# Patient Record
Sex: Male | Born: 1960 | Race: White | Hispanic: No | Marital: Married | Smoking: Never smoker
Health system: Southern US, Community
[De-identification: ages and names within clinical notes are randomized; demographics above are authoritative.]

## PROBLEM LIST (undated history)

## (undated) DIAGNOSIS — R202 Paresthesia of skin: Secondary | ICD-10-CM

## (undated) DIAGNOSIS — I2510 Atherosclerotic heart disease of native coronary artery without angina pectoris: Secondary | ICD-10-CM

## (undated) DIAGNOSIS — I10 Essential (primary) hypertension: Secondary | ICD-10-CM

## (undated) DIAGNOSIS — I4891 Unspecified atrial fibrillation: Secondary | ICD-10-CM

## (undated) DIAGNOSIS — Z951 Presence of aortocoronary bypass graft: Secondary | ICD-10-CM

## (undated) DIAGNOSIS — E785 Hyperlipidemia, unspecified: Secondary | ICD-10-CM

## (undated) DIAGNOSIS — K219 Gastro-esophageal reflux disease without esophagitis: Secondary | ICD-10-CM

## (undated) DIAGNOSIS — R079 Chest pain, unspecified: Secondary | ICD-10-CM

## (undated) DIAGNOSIS — I214 Non-ST elevation (NSTEMI) myocardial infarction: Secondary | ICD-10-CM

## (undated) DIAGNOSIS — E119 Type 2 diabetes mellitus without complications: Secondary | ICD-10-CM

## (undated) DIAGNOSIS — E039 Hypothyroidism, unspecified: Secondary | ICD-10-CM

## (undated) DIAGNOSIS — E0590 Thyrotoxicosis, unspecified without thyrotoxic crisis or storm: Secondary | ICD-10-CM

## (undated) HISTORY — DX: Thyrotoxicosis, unspecified without thyrotoxic crisis or storm: E05.90

## (undated) HISTORY — DX: Type 2 diabetes mellitus without complications: E11.9

## (undated) HISTORY — DX: Unspecified atrial fibrillation: I48.91

## (undated) HISTORY — DX: Gastro-esophageal reflux disease without esophagitis: K21.9

## (undated) HISTORY — PX: UMBILICAL HERNIA REPAIR: SHX196

## (undated) HISTORY — DX: Essential (primary) hypertension: I10

## (undated) HISTORY — DX: Chest pain, unspecified: R07.9

## (undated) HISTORY — PX: SHOULDER SURGERY: SHX246

## (undated) HISTORY — DX: Atherosclerotic heart disease of native coronary artery without angina pectoris: I25.10

## (undated) HISTORY — DX: Hyperlipidemia, unspecified: E78.5

## (undated) HISTORY — DX: Hypothyroidism, unspecified: E03.9

## (undated) HISTORY — DX: Non-ST elevation (NSTEMI) myocardial infarction: I21.4

## (undated) HISTORY — DX: Presence of aortocoronary bypass graft: Z95.1

## (undated) HISTORY — DX: Paresthesia of skin: R20.2

---

## 2000-04-16 ENCOUNTER — Ambulatory Visit (HOSPITAL_BASED_OUTPATIENT_CLINIC_OR_DEPARTMENT_OTHER): Admission: RE | Admit: 2000-04-16 | Discharge: 2000-04-16 | Payer: Self-pay | Admitting: General Surgery

## 2004-12-26 ENCOUNTER — Ambulatory Visit: Payer: Self-pay | Admitting: Family Medicine

## 2005-01-07 ENCOUNTER — Ambulatory Visit: Payer: Self-pay | Admitting: Family Medicine

## 2005-01-15 ENCOUNTER — Ambulatory Visit: Payer: Self-pay | Admitting: Family Medicine

## 2005-12-17 ENCOUNTER — Ambulatory Visit: Payer: Self-pay | Admitting: Family Medicine

## 2009-01-22 ENCOUNTER — Ambulatory Visit: Payer: Self-pay | Admitting: Cardiology

## 2009-01-22 ENCOUNTER — Inpatient Hospital Stay (HOSPITAL_COMMUNITY): Admission: EM | Admit: 2009-01-22 | Discharge: 2009-01-23 | Payer: Self-pay | Admitting: Emergency Medicine

## 2009-01-22 ENCOUNTER — Ambulatory Visit: Payer: Self-pay | Admitting: Internal Medicine

## 2009-01-24 ENCOUNTER — Ambulatory Visit: Payer: Self-pay

## 2009-03-01 DIAGNOSIS — R079 Chest pain, unspecified: Secondary | ICD-10-CM | POA: Insufficient documentation

## 2009-03-01 DIAGNOSIS — K219 Gastro-esophageal reflux disease without esophagitis: Secondary | ICD-10-CM

## 2009-03-01 DIAGNOSIS — E039 Hypothyroidism, unspecified: Secondary | ICD-10-CM

## 2009-03-01 DIAGNOSIS — E785 Hyperlipidemia, unspecified: Secondary | ICD-10-CM

## 2009-03-01 DIAGNOSIS — I10 Essential (primary) hypertension: Secondary | ICD-10-CM

## 2009-03-01 HISTORY — DX: Essential (primary) hypertension: I10

## 2009-03-01 HISTORY — DX: Hypothyroidism, unspecified: E03.9

## 2009-03-01 HISTORY — DX: Chest pain, unspecified: R07.9

## 2009-03-01 HISTORY — DX: Hyperlipidemia, unspecified: E78.5

## 2009-03-01 HISTORY — DX: Gastro-esophageal reflux disease without esophagitis: K21.9

## 2009-03-05 ENCOUNTER — Ambulatory Visit: Payer: Self-pay | Admitting: Cardiology

## 2011-02-20 LAB — BASIC METABOLIC PANEL
BUN: 12 mg/dL (ref 6–23)
Calcium: 8.9 mg/dL (ref 8.4–10.5)
Chloride: 110 mEq/L (ref 96–112)
Creatinine, Ser: 1.07 mg/dL (ref 0.4–1.5)
Creatinine, Ser: 1.14 mg/dL (ref 0.4–1.5)
GFR calc Af Amer: >60 mL/min (ref >60)
GFR calc Af Amer: >60 mL/min (ref >60)
GFR calc non Af Amer: >60 mL/min (ref >60)
Sodium: 142 mEq/L (ref 135–145)

## 2011-02-20 LAB — CBC
HCT: 39 % (ref 39.0–52.0)
MCV: 88.7 fL (ref 78.0–100.0)
Platelets: 196 10*3/uL (ref 150–400)
Platelets: 213 10*3/uL (ref 150–400)
RDW: 13.4 % (ref 11.5–15.5)
WBC: 5 10*3/uL (ref 4.0–10.5)

## 2011-02-20 LAB — HEPATIC FUNCTION PANEL
ALT: 26 U/L (ref 0–53)
Albumin: 4 g/dL (ref 3.5–5.2)
Alkaline Phosphatase: 52 U/L (ref 39–117)
Total Protein: 6.6 g/dL (ref 6.0–8.3)

## 2011-02-20 LAB — CARDIAC PANEL(CRET KIN+CKTOT+MB+TROPI)
CK, MB: 0.9 ng/mL (ref 0.3–4.0)
CK, MB: 1 ng/mL (ref 0.3–4.0)
CK, MB: 0.8 ng/mL (ref 0.3–4.0)
Relative Index: 0.6 (ref 0.0–2.5)
Total CK: 156 U/L (ref 7–232)
Total CK: 183 U/L (ref 7–232)
Troponin I: <0.01 ng/mL (ref 0.00–0.06)
Troponin I: <0.01 ng/mL (ref 0.00–0.06)

## 2011-02-20 LAB — RAPID URINE DRUG SCREEN, HOSP PERFORMED: Benzodiazepines: POSITIVE — AB

## 2011-02-20 LAB — PROTIME-INR
INR: 1 (ref 0.00–1.49)
Prothrombin Time: 13.2 seconds (ref 11.6–15.2)

## 2011-02-20 LAB — TSH: TSH: 2.436 u[IU]/mL (ref 0.350–4.500)

## 2011-02-20 LAB — TROPONIN I: Troponin I: <0.01 ng/mL (ref 0.00–0.06)

## 2011-02-20 LAB — LIPID PANEL
Triglycerides: 255 mg/dL — ABNORMAL HIGH (ref <150)
VLDL: 51 mg/dL — ABNORMAL HIGH (ref 0–40)

## 2011-02-20 LAB — CK TOTAL AND CKMB (NOT AT ARMC)
CK, MB: 0.8 ng/mL (ref 0.3–4.0)
Relative Index: 0.5 (ref 0.0–2.5)
Total CK: 175 U/L (ref 7–232)

## 2011-02-20 LAB — APTT: aPTT: 33 seconds (ref 24–37)

## 2011-02-20 LAB — POCT CARDIAC MARKERS: Troponin i, poc: <0.05 ng/mL (ref 0.00–0.09)

## 2011-02-20 LAB — HEPARIN LEVEL (UNFRACTIONATED): Heparin Unfractionated: 0.17 IU/mL — ABNORMAL LOW (ref 0.30–0.70)

## 2011-02-20 LAB — LIPASE, BLOOD: Lipase: 23 U/L (ref 11–59)

## 2011-03-25 NOTE — Consult Note (Signed)
Nicholas Chambers, Nicholas Chambers             ACCOUNT NO.:  [id number]   MEDICAL RECORD NO.:  [id number]          PATIENT TYPE:  INP   LOCATION:  2921                         FACILITY:  [facility]   PHYSICIAN:  Luis Abed, MD, FACCDATE OF BIRTH:  August 17, 1961   DATE OF CONSULTATION:  01/22/2009  DATE OF DISCHARGE:                                 CONSULTATION   PRIMARY CARDIOLOGIST:  New, being seen by Luis Abed, MD.   PRIMARY CARE PHYSICIAN:  Dr. Dina Rich in [place].   Mr. Cavanagh is a pleasant 50 year old Caucasian gentleman with no known  history of coronary artery disease, who presented to [facility]  Emergency Room today with complaints of chest discomfort that started on  Saturday.  Two days ago, he was shopping with his children.  He began to  have this localized chest discomfort, then radiating to both shoulders.  He described it as ache and pressure.  He rated it 6 on a scale of 1-10  at its worse.  He feels like it was worse with exertion and improved  with rest.  He said in his left chest, it felt like indigestion and  pressure.  He tried some Maalox without relief.  Continued  intermittently through the day.  Saturday night, he states, he did not  feel well.  He became dizzy, was restless, unable to get comfortable.  Sunday morning, same scenario.  He tried Gas-X without relief.  At 1:20  Sunday, he became lightheaded and dizzy, got very diaphoretic, states he  felt like he was going to pass out and he states he went down on one  knee.  After that, he laid down on the sofa and tried to rest for most  of Sunday.  Sunday evening, actually felt a little bit better, got up  this morning to go to work, states he took a shower, got ready for work,  went to work, discomfort returned and once again felt dizzy.  He decided  he needed to get evaluated.  In the emergency room, he was given aspirin  and nitroglycerin sublingual with relief in his chest discomfort.  He  was given a  heparin bolus of 4000 units and a drip was started.  He then  had nitroglycerin 1 inch paste applied.  His chest pain went from a 6 to  a 3.  Currently, he is rating it a zero.  The patient states he does not  normally have problems with indigestion or heartburn, but felt like this  was indigestion-type sensation.  He states he tried to burp, but did not  seem to relieve the discomfort at all, does not recall eating anything  unusual for him.  Normally, he is rather active gentleman.  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   PAST MEDICAL HISTORY:  Positive for hypertension for 15 years,  hypothyroidism which is treated, dyslipidemia, had an epigastric hernia  surgery  done in the past, has had surgery to both shoulders.   He lives in [place] with his kids.  He has a Health and safety inspector job.  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  He is divorced.  Denies any tobacco use or EtOH drugs.   FAMILY HISTORY:  Positive for some heart abnormality in his mother.  The  patient is unclear and has not talked with her yet today.  Maternal  grandmother did have MI and passed away at age 52.  Father's history, he  did not know his father.  No siblings with known coronary artery  disease.   REVIEW OF SYSTEMS:  Positive for sweats, chest pain, dyspnea on  exertion, presyncope, nausea, and GERD symptoms all as described above,  otherwise negative.   ALLERGIES:  No known drug allergies.   At home, the patient is on Diovan HCTZ unknown dose, Synthroid unknown  dose, and Niaspan 2000 mg at bedtime.  The patient also tried Maalox  p.r.n. and Gas-X p.r.n. over the weekend.   PHYSICAL EXAMINATION:  VITAL SIGNS:  Temperature 98.2, heart rate 66,  respirations 20, blood pressure 134/74, and sat 98% on room air.  GENERAL:  In no acute  distress.  HEENT:  Unremarkable.  NECK:  Supple without bruits or JVD.  CARDIOVASCULAR:  S1 and S2.  Regular rate and rhythm.  LUNGS:  Clear to auscultation bilaterally.  The patient does not have  any reproducible chest discomfort with palpation.  SKIN:  Warm and dry.  ABDOMEN:  Soft, nontender, positive bowel sounds.  LOWER EXTREMITIES:  Without clubbing, cyanosis, or edema.  NEUROLOGIC:  Alert and oriented x3.   Chest x-ray showed no acute findings, has some chronic changes noted.  EKG, sinus tach 106, no acute findings.   LABORATORY WORK:  H&H 14.3 and 40.9, WBCs 5, and platelets 213,000.  Sodium 138, potassium 3.7, creatinine 1.14, and glucose 116.  TSH within  normal limits.  D-dimer negative.  Point-of-care negative x1 set and  first set of cardiac enzymes negative.   ASSESSMENT:  1. Chest pain with unclear etiology.  Recommend cycle cardiac enzymes,      can probably proceed with outpatient stress Myoview.  2. Hypertension.  Blood pressure well controlled at this time.  3. Hypothyroidism with a stable TSH.  4. Dyslipidemia.  Fasting lipids pending.   Dr. Jerral Bonito has been in to examine and assess the patient.  We have  reviewed at length data with the patient, stories concerning for cardiac  in etiology, but EKG and enzymes are negative, would consider  discharging the patient home in the a.m. and schedule him for outpatient  stress Myoview in our office.      Dorian Pod, ACNP      Luis Abed, MD, [facility]  Electronically Signed    MB/MEDQ  D:  01/22/2009  T:  01/23/2009  Job:  [id number]

## 2011-03-25 NOTE — Discharge Summary (Signed)
Nicholas Chambers, Nicholas Chambers             ACCOUNT NO.:  [id number]   MEDICAL RECORD NO.:  [id number]          PATIENT TYPE:  INP   LOCATION:  2921                         FACILITY:  [facility]   PHYSICIAN:  Madaline Guthrie, M.D.    DATE OF BIRTH:  Dec 04, 1960   DATE OF ADMISSION:  01/22/2009  DATE OF DISCHARGE:  01/23/2009                               DISCHARGE SUMMARY   PRIMARY CARE PHYSICIAN:  Dr. [facility].   Here in the hospital cardiology was consulted the [facility].   DISCHARGE DIAGNOSES:  1. Reason for admission was chest pain, still of unknown etiology.  2. Other acute problems, hyperlipidemia, hypertension, hypothyroidism.   DISCHARGE MEDICATIONS WITH ACCURATE DOSES:  1. Pantoprazole or Protonix 40 mg daily by mouth.  2. Niacin 500 mg p.o. at bedtime as well as aspirin 81 mg p.o. daily.   DISPOSITION:  Tomorrow January 24, 2009, the patient it to report to the  [facility] for a stress Myoview at 1:30 p.m., the  telephone number is [phone], another appointment with Dr. Myrtis Ser of the  [facility] on February 07, 2009, at 11:45 a.m. the number is [phone].  Additionally, the patient should schedule an appointment with his  primary care physician Dr. Sol Passer of [facility] in the  near future to discuss better management of his hyperlipidemia.   PROCEDURES PERFORMED DURING HIS HOSPITAL STAY:  [name] had two  EKGs, those were all the procedures performed.   CONSULTATIONS:  The cardiology service consulted on this patient from  his admission until he was discharged and the recommendations were well  appreciated.   BRIEF HISTORY AND PHYSICAL:  This is a 50 year old male with history  significant for hypertension, hypothyroidism, as well as hyperlipidemia  who presented to the ED with a 3-day history of substernal chest pain  which radiated to the right shoulder.  He describes the pain as a  constant aching pressure that is somewhat  alleviated with rest and  exacerbated with ambulatory activity, at its worst it is 6/10 on the  pain scale, but normally it is 3-5/10.  He decided to come to the ED  when he did because the pain limited his ability to work and when he  became noticeable short of breath and diaphoretic, he sought medical  care.  The pain is not aggravated by coughing, deep breathing, eating,  or palpation and is not relieved with antacids or burping.  The patient  has no history of heart disease whatsoever, but the patient's relevant  risk factors are:  Positive for family history of premature coronary  artery disease, hyperlipidemia, and hypertension; negative for diabetes  mellitus, deep vein thrombosis, or smoking.  In the ED, the patient's  pain was relieved with sublingual nitroglycerin.   ALLERGIES:  The patient has no known drug allergies.   PAST MEDICAL HISTORY:  Significant for a diagnosis of hypothyroidism in  1995, hypertension in 1996, and hyperlipidemia.   SURGERIES THE PATIENT HAS UNDERGONE IN THE PAST:  Right shoulder surgery  in 1992, a left clavicular fracture repair in 2009, as well as  a ventral  hernia repair in 2003.  He denies any other hospitalizations for other  reasons.   MEDICATIONS AT HOME:  1. Synthroid for his hypothyroidism.  2. Diovan for hypertension.  3. Maalox p.r.n. as well as niacin for control of his lipids.   PRESENTING PHYSICAL EXAMINATION:  VITAL SIGNS:  Temperature 98.2, blood  pressure 159/95 with a pulse of 100, respiratory rate of 20, O2 sat of  96% on room air.  GENERAL:  He is in no apparent distress, resting in the bed, talking  with his family.  HEENT:  Eyes:  Pupils equal, round, reactive to light and accommodation.  Extraocular muscles were intact.  Oropharynx was clear with no exudate.  Tympanic membranes were clear.  NECK:  Full range of motion with no pain.  Thyroid midline.  No masses  or lymphadenopathy.  RESPIRATORY:  Lungs were clear to  auscultation bilaterally.  No wheeze  or rales appreciated and equal chest expansion bilaterally.  CARDIOVASCULAR:  Regular rate and rhythm.  No murmurs, gallops, or rubs.  Distal pulses 2+.  No JVD appreciated and a nondisplaced PMI.  GI:  Abdomen nontender, nondistended somewhat protuberant with positive  bowel sounds and no organomegaly palpated.  EXTREMITIES:  No edema, cyanosis, or clubbing.  GU:  Deferred.  SKIN:  No rashes or lacerations noted.  No lymphadenopathy.  NEURO:  Normal range of motion in all 4 limbs.  Strength 5/5.  No focal  neurologic deficits appreciated.  Cranial nerves II through XII grossly  intact.  Sensation is intact to fine touch and pain throughout.  PSYCH:  Alert and oriented x3 and an appropriate affect.   ADMITTING LABORATORY DATA:  Chemistry panel:  Sodium 138, potassium 3.7,  chloride 105, carbon dioxide is 25, BUN 12, creatinine 1.14 with a  glucose of 116, and calcium 9.3.  CBC includes white blood cell count of  5.0, hemoglobin 14.3, hematocrit 40.9, platelets 213.  Coagulation panel  includes APTT of 33, PT of 13.2, INR 1.0.  D-dimer was obtained, it was  less than 0.22.  TSH 2.436.  Initial point-of-care cardiac enzymes with  a CK of 175, CK-MB of 0.8, and a troponin I less than 0.01.  A chest x-  ray was obtained initially which showed a normal heart size.  No  evidence of congestive heart failure or pneumonia.   ASSESSMENT:  He is a 50 year old male with risk factors for  hypertension, hyperlipidemia, slight obesity, and positive family  history of coronary artery disease who presents with left-sided  substernal chest pain occurring both with exertion and at rest.  Given  his risk factors most likely ACS and persistent chest pain with EKG  changes is concerning.  Cardiac enzymes, however, initially were  negative.   DIFFERENTIAL DIAGNOSES:  1. Acute coronary syndrome.  2. Pulmonary embolism.  3. Pneumonia.  4. Pneumothorax.  5. Aortic  dissection.  6. Gastroesophageal reflux disease.  7. Costochondritis, etc.   PLAN:  1. Concerning his chest pain, he was admitted to Step-Down Unit and      cardiac enzymes were cycled.  A repeat EKG will be obtained and he      will be monitored on telemetry.  He is started on aspirin,      metoprolol, and Zocor.  Given his low Wells score of 1.5, pulmonary      embolism is unlikely, but it will be ruled out with a D-dimer.      Given his presentation, heparin IV  will be initiated and a 2-view      chest x-ray will also be obtained to further visualize the thoracic      cavity.  We will also check urine drug screen, a full lipid panel,      TSH, and A1c, liver function test, and a lipase level, and also      ordered the sublingual nitroglycerin for pain relief.  2. Concerning his hypothyroidism, we will continue his home dose of      Synthroid and check TSH.  3. For hypertension, we will hold valsartan and continue monitoring      his pressures.  4. Prophylaxis.  Protonix 40 mg p.o. daily.  5. FEN/GI, normal saline 150 mL/hour heart healthy diet.   DISPOSITION:  Step-Down Unit with discharge potentially on the day after  admission.   HOSPITAL COURSE:  1. Chest pain.  The patient was admitted to Step-Down Unit and cardiac      enzymes were cycled x3 which were all negative, which was      reassuring for the patient.  He did have slight EKG changes with      inverted T-waves in several other leads and questionable ST-      depression in the couple of leads.  We worked this up by      considering pulmonary causes including pulmonary embolism which was      ruled out with a negative D-dimer.  He was started on heparin IV      and additionally a 2-view chest x-ray effectively ruled out the      chances of a pneumonia or pneumothorax.  A full lipid panel was      checked and this was concerning as his total cholesterol was 254,      triglycerides were elevated at 255, HDL was low at  34, LDL was high      at 169.  So, it will be imperative for him in the future to      effectively lower his LDL level to lower his risk of future cardiac      events.  Throughout the hospital stay, the patient has been      administered Protonix for the GI prophylaxis.  There is likelihood      that this could be GI related as the patient says that sometimes      the pain feels like indigestion.  Following the discharge, the      patient will be instructed to continue taking the Protonix 40 mg      daily at home to prevent this pain from occurring again should that      have been the cause.  2. Hypothyroidism.  We have continued his home dose of Synthroid here      in the hospital and we checked the TSH level which was within      normal limits.  3. Hypertension.  His blood pressures have been stable and not overly      elevated during his stay.  We held his home dose of valsartan, but      we will defer to his primary care physician in the future for      managing his hypertension.   DISCHARGE LABORATORY DATA AND VITALS:  On the day of discharge, the  patient's vitals included a temperature of 98.8, systolic blood pressure  134, diastolic blood pressure is 70, pulse of 59, respiratory rate of  20, oxygen saturation 98% on room air.  His labs included a chemistry  panel of sodium 142, potassium 4.1, chloride 110, CO2 28, BUN 11,  creatinine 1.09 with a glucose of 100.  CBC included a white blood cell  count of 5.0, hemoglobin of 14.3, hematocrit of 40.9, and platelet count  of 213.  Cardiac enzymes x3 were all negative.  A1c was checked to be  5.7.  TSH 1.794.  The aforementioned full lipid panel included a total  cholesterol of 254, triglycerides  of 255, HDL 34, and LDL of 169.  Coagulation panel included PTT of 33,  PT of 13.2, and INR of 1.0.  LFTs include total bilirubin of 1.1, 1.0  being indirect.  Alkaline phosphatase 52, AST 22, ALT 26, total protein  6.6, albumin 4.0, and  concerning the chemistries an 8.9 calcium level.      Zara Council, MD  Electronically Signed      Madaline Guthrie, M.D.  Electronically Signed    AS/MEDQ  D:  01/23/2009  T:  01/24/2009  Job:  [id number]

## 2011-03-28 NOTE — Op Note (Signed)
[facility]  Patient:    Nicholas Chambers, Nicholas Chambers                    MRN: [id number] Proc. Date: 04/16/00 Adm. Date:  [id number] Disc. Date: [id number] Attending:  Henrene Dodge CC:         Anselm Pancoast. Zachery Dakins, M.D.                           Operative Report  PREOPERATIVE DIAGNOSIS:  Epigastric hernia.  POSTOPERATIVE DIAGNOSIS:  Epigastric hernia.  OPERATION:  Repair of epigastric hernia with mesh reinforcement.  SURGEON:  Anselm Pancoast. Zachery Dakins, M.D.  ASSISTANT:  Nurse.  ANESTHESIA:  General.  HISTORY:  Nicholas Chambers is a 50 year old moderately overweight male who approximately a week or so ago developed a bulge in the epigastric area after, I think, he was coughing.  This has been mildly symptomatic. It is kind of a palpable area, sort of midway between the umbilicus and xiphoid and I was not able to reduce it in the office.  I saw him approximately a week ago and recommended that we repair this with mesh reinforcement with general anesthesia.  He is in agreement. During the interim, the area has decreased slightly in size, but it is still palpable and still mildly painful.  DESCRIPTION OF PROCEDURE:  The patient preoperatively was given a gram of Kefzol, IV stated, positioned on the OR table and induction of general anesthesia.  An LOA tube was used.  The epigastric area was first shaved and then prepped with Betadine solution and draped sterilely.  I made a little vertical incision about 2-1/2 inches in length down through the preperitoneal fatty tissue and then dissected down into the hernia sac that was about the size of a silver dollar, but the little fascial defect was quite small.  I removed the peritoneal area that was protruded up.  The little pedicle was ligated with Vicryl and then I basically enlarged the opening so it was about an inch in diameter.  With this, I could run the finger in the preperitoneal area and dissect a  little space away so that a little piece of mesh, about 3 inches x 2 inches could be placed vertically under the area and this was anchored in the corners and all four corners with 2-0 Prolene.  Next, I closed the little defect incorporating the mid portion of the mesh which closes without tension.  Next, the subcutaneous tissue was closed with 4-0 Vicryl and then Benzoin and Steri-Strips on the skin.  The patient tolerated the procedure nicely.  I injected the fascia with Marcaine to hopefully minimize the postoperative pain.  He should not have any problems with eating and will be seen in the office in approximately a week and, hopefully, he will be able to return to work next week.  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 DD:  04/16/00 TD:  04/20/00 Job: [id number]

## 2014-06-09 DIAGNOSIS — R202 Paresthesia of skin: Secondary | ICD-10-CM

## 2014-06-09 HISTORY — DX: Paresthesia of skin: R20.2

## 2016-12-25 DIAGNOSIS — R0602 Shortness of breath: Secondary | ICD-10-CM | POA: Diagnosis not present

## 2016-12-25 DIAGNOSIS — R0789 Other chest pain: Secondary | ICD-10-CM | POA: Diagnosis not present

## 2016-12-25 DIAGNOSIS — K219 Gastro-esophageal reflux disease without esophagitis: Secondary | ICD-10-CM | POA: Diagnosis not present

## 2016-12-29 DIAGNOSIS — E039 Hypothyroidism, unspecified: Secondary | ICD-10-CM | POA: Diagnosis not present

## 2016-12-29 DIAGNOSIS — E1165 Type 2 diabetes mellitus with hyperglycemia: Secondary | ICD-10-CM | POA: Diagnosis not present

## 2016-12-29 DIAGNOSIS — R5383 Other fatigue: Secondary | ICD-10-CM | POA: Diagnosis not present

## 2016-12-29 DIAGNOSIS — E785 Hyperlipidemia, unspecified: Secondary | ICD-10-CM | POA: Diagnosis not present

## 2016-12-29 DIAGNOSIS — M109 Gout, unspecified: Secondary | ICD-10-CM | POA: Diagnosis not present

## 2016-12-29 DIAGNOSIS — K219 Gastro-esophageal reflux disease without esophagitis: Secondary | ICD-10-CM | POA: Diagnosis not present

## 2017-01-12 DIAGNOSIS — K219 Gastro-esophageal reflux disease without esophagitis: Secondary | ICD-10-CM | POA: Diagnosis not present

## 2017-01-20 DIAGNOSIS — K219 Gastro-esophageal reflux disease without esophagitis: Secondary | ICD-10-CM | POA: Diagnosis not present

## 2017-02-13 DIAGNOSIS — K208 Other esophagitis: Secondary | ICD-10-CM | POA: Diagnosis not present

## 2017-02-13 DIAGNOSIS — K219 Gastro-esophageal reflux disease without esophagitis: Secondary | ICD-10-CM | POA: Diagnosis not present

## 2017-03-18 DIAGNOSIS — K219 Gastro-esophageal reflux disease without esophagitis: Secondary | ICD-10-CM | POA: Diagnosis not present

## 2017-09-16 DIAGNOSIS — Z1331 Encounter for screening for depression: Secondary | ICD-10-CM | POA: Diagnosis not present

## 2017-09-16 DIAGNOSIS — E039 Hypothyroidism, unspecified: Secondary | ICD-10-CM | POA: Diagnosis not present

## 2017-09-16 DIAGNOSIS — M109 Gout, unspecified: Secondary | ICD-10-CM | POA: Diagnosis not present

## 2017-09-16 DIAGNOSIS — Z6841 Body Mass Index (BMI) 40.0 and over, adult: Secondary | ICD-10-CM | POA: Diagnosis not present

## 2017-09-16 DIAGNOSIS — E1165 Type 2 diabetes mellitus with hyperglycemia: Secondary | ICD-10-CM | POA: Diagnosis not present

## 2017-09-16 DIAGNOSIS — Z23 Encounter for immunization: Secondary | ICD-10-CM | POA: Diagnosis not present

## 2017-09-16 DIAGNOSIS — Z1339 Encounter for screening examination for other mental health and behavioral disorders: Secondary | ICD-10-CM | POA: Diagnosis not present

## 2017-11-10 DIAGNOSIS — I6529 Occlusion and stenosis of unspecified carotid artery: Secondary | ICD-10-CM | POA: Insufficient documentation

## 2017-11-10 HISTORY — DX: Occlusion and stenosis of unspecified carotid artery: I65.29

## 2017-11-19 DIAGNOSIS — M109 Gout, unspecified: Secondary | ICD-10-CM | POA: Diagnosis not present

## 2017-11-19 DIAGNOSIS — E1165 Type 2 diabetes mellitus with hyperglycemia: Secondary | ICD-10-CM | POA: Diagnosis not present

## 2017-11-19 DIAGNOSIS — E7800 Pure hypercholesterolemia, unspecified: Secondary | ICD-10-CM | POA: Diagnosis not present

## 2017-11-19 DIAGNOSIS — Z6841 Body Mass Index (BMI) 40.0 and over, adult: Secondary | ICD-10-CM | POA: Diagnosis not present

## 2018-01-12 DIAGNOSIS — E7800 Pure hypercholesterolemia, unspecified: Secondary | ICD-10-CM | POA: Diagnosis not present

## 2018-01-12 DIAGNOSIS — M109 Gout, unspecified: Secondary | ICD-10-CM | POA: Diagnosis not present

## 2018-01-18 DIAGNOSIS — E1165 Type 2 diabetes mellitus with hyperglycemia: Secondary | ICD-10-CM | POA: Diagnosis not present

## 2018-01-18 DIAGNOSIS — E7800 Pure hypercholesterolemia, unspecified: Secondary | ICD-10-CM | POA: Diagnosis not present

## 2018-01-18 DIAGNOSIS — Z6841 Body Mass Index (BMI) 40.0 and over, adult: Secondary | ICD-10-CM | POA: Diagnosis not present

## 2018-01-18 DIAGNOSIS — I10 Essential (primary) hypertension: Secondary | ICD-10-CM | POA: Diagnosis not present

## 2018-03-28 DIAGNOSIS — Z23 Encounter for immunization: Secondary | ICD-10-CM | POA: Diagnosis not present

## 2018-03-28 DIAGNOSIS — W458XXA Other foreign body or object entering through skin, initial encounter: Secondary | ICD-10-CM | POA: Diagnosis not present

## 2018-03-28 DIAGNOSIS — S60352A Superficial foreign body of left thumb, initial encounter: Secondary | ICD-10-CM | POA: Diagnosis not present

## 2018-05-17 ENCOUNTER — Inpatient Hospital Stay (HOSPITAL_COMMUNITY)
Admission: AD | Admit: 2018-05-17 | Discharge: 2018-05-23 | DRG: 234 | Disposition: A | Discharge status: Home / Self Care | Payer: BLUE CROSS/BLUE SHIELD | Source: Other Acute Inpatient Hospital | Attending: Surgery | Admitting: Surgery

## 2018-05-17 DIAGNOSIS — Z6841 Body Mass Index (BMI) 40.0 and over, adult: Secondary | ICD-10-CM

## 2018-05-17 DIAGNOSIS — E785 Hyperlipidemia, unspecified: Secondary | ICD-10-CM | POA: Diagnosis not present

## 2018-05-17 DIAGNOSIS — E039 Hypothyroidism, unspecified: Secondary | ICD-10-CM | POA: Diagnosis not present

## 2018-05-17 DIAGNOSIS — Z951 Presence of aortocoronary bypass graft: Secondary | ICD-10-CM

## 2018-05-17 DIAGNOSIS — Z8249 Family history of ischemic heart disease and other diseases of the circulatory system: Secondary | ICD-10-CM

## 2018-05-17 DIAGNOSIS — I25110 Atherosclerotic heart disease of native coronary artery with unstable angina pectoris: Secondary | ICD-10-CM | POA: Diagnosis not present

## 2018-05-17 DIAGNOSIS — R0989 Other specified symptoms and signs involving the circulatory and respiratory systems: Secondary | ICD-10-CM | POA: Diagnosis not present

## 2018-05-17 DIAGNOSIS — I2510 Atherosclerotic heart disease of native coronary artery without angina pectoris: Secondary | ICD-10-CM

## 2018-05-17 DIAGNOSIS — Z7984 Long term (current) use of oral hypoglycemic drugs: Secondary | ICD-10-CM | POA: Diagnosis not present

## 2018-05-17 DIAGNOSIS — I959 Hypotension, unspecified: Secondary | ICD-10-CM | POA: Diagnosis not present

## 2018-05-17 DIAGNOSIS — E8770 Fluid overload, unspecified: Secondary | ICD-10-CM | POA: Diagnosis not present

## 2018-05-17 DIAGNOSIS — R079 Chest pain, unspecified: Secondary | ICD-10-CM | POA: Diagnosis not present

## 2018-05-17 DIAGNOSIS — I4891 Unspecified atrial fibrillation: Secondary | ICD-10-CM

## 2018-05-17 DIAGNOSIS — I10 Essential (primary) hypertension: Secondary | ICD-10-CM | POA: Diagnosis not present

## 2018-05-17 DIAGNOSIS — I480 Paroxysmal atrial fibrillation: Secondary | ICD-10-CM | POA: Diagnosis not present

## 2018-05-17 DIAGNOSIS — J9811 Atelectasis: Secondary | ICD-10-CM | POA: Diagnosis not present

## 2018-05-17 DIAGNOSIS — D62 Acute posthemorrhagic anemia: Secondary | ICD-10-CM | POA: Diagnosis not present

## 2018-05-17 DIAGNOSIS — E119 Type 2 diabetes mellitus without complications: Secondary | ICD-10-CM | POA: Diagnosis present

## 2018-05-17 DIAGNOSIS — I214 Non-ST elevation (NSTEMI) myocardial infarction: Secondary | ICD-10-CM | POA: Diagnosis not present

## 2018-05-17 DIAGNOSIS — E7800 Pure hypercholesterolemia, unspecified: Secondary | ICD-10-CM | POA: Diagnosis not present

## 2018-05-17 DIAGNOSIS — Z79890 Hormone replacement therapy (postmenopausal): Secondary | ICD-10-CM

## 2018-05-17 DIAGNOSIS — I200 Unstable angina: Secondary | ICD-10-CM | POA: Diagnosis not present

## 2018-05-17 DIAGNOSIS — Z833 Family history of diabetes mellitus: Secondary | ICD-10-CM

## 2018-05-17 DIAGNOSIS — Z4682 Encounter for fitting and adjustment of non-vascular catheter: Secondary | ICD-10-CM | POA: Diagnosis not present

## 2018-05-17 DIAGNOSIS — I340 Nonrheumatic mitral (valve) insufficiency: Secondary | ICD-10-CM | POA: Diagnosis not present

## 2018-05-17 DIAGNOSIS — Z01810 Encounter for preprocedural cardiovascular examination: Secondary | ICD-10-CM | POA: Diagnosis not present

## 2018-05-17 DIAGNOSIS — R0602 Shortness of breath: Secondary | ICD-10-CM | POA: Diagnosis not present

## 2018-05-17 DIAGNOSIS — R0789 Other chest pain: Secondary | ICD-10-CM | POA: Diagnosis not present

## 2018-05-17 DIAGNOSIS — K219 Gastro-esophageal reflux disease without esophagitis: Secondary | ICD-10-CM | POA: Diagnosis not present

## 2018-05-17 HISTORY — DX: Non-ST elevation (NSTEMI) myocardial infarction: I21.4

## 2018-05-17 MED ORDER — NITROGLYCERIN 0.4 MG SL SUBL: 0.4000 mg | SUBLINGUAL_TABLET | SUBLINGUAL | Status: DC | PRN

## 2018-05-17 MED ORDER — ACETAMINOPHEN 325 MG PO TABS: 650.0000 mg | ORAL_TABLET | ORAL | Status: DC | PRN

## 2018-05-17 MED ORDER — INSULIN ASPART 100 UNIT/ML ~~LOC~~ SOLN: 0.0000 [IU] | Freq: Every day | SUBCUTANEOUS | Status: DC

## 2018-05-17 MED ORDER — ATORVASTATIN CALCIUM 80 MG PO TABS
80.0000 mg | ORAL_TABLET | Freq: Every day | ORAL | Status: DC
  Administered 2018-05-18 – 2018-05-22 (×4): 80 mg via ORAL
  Filled 2018-05-17 (×4): qty 1

## 2018-05-17 MED ORDER — INSULIN ASPART 100 UNIT/ML ~~LOC~~ SOLN
0.0000 [IU] | Freq: Three times a day (TID) | SUBCUTANEOUS | Status: DC
  Administered 2018-05-18: 2 [IU] via SUBCUTANEOUS
  Administered 2018-05-18: 3 [IU] via SUBCUTANEOUS

## 2018-05-17 MED ORDER — ONDANSETRON HCL 4 MG/2ML IJ SOLN: 4.0000 mg | Freq: Four times a day (QID) | INTRAMUSCULAR | Status: DC | PRN

## 2018-05-17 MED ORDER — ASPIRIN EC 81 MG PO TBEC
81.0000 mg | DELAYED_RELEASE_TABLET | Freq: Every day | ORAL | Status: DC
  Administered 2018-05-18: 81 mg via ORAL
  Filled 2018-05-17: qty 1

## 2018-05-17 NOTE — H&P (Signed)
Cardiology History & Physical    Patient ID: Nicholas MostGregory D Elza MRN: [id number], DOB: 04-23-1961 Date of Encounter: 05/17/2018, 11:47 PM Primary Physician: No primary care provider on file.  Chief Complaint: Chest pain   HPI: Nicholas Chambers is a 57 y.o. male with history of hypertension, diabetes, obesity, who presents with substernal chest pain.  The patient was doing well until about a week prior to presentation, when he felt the onset of substernal chest pain after about 20 to 30 minutes of exertion.  This was associated with significant shortness of breath.  The chest discomfort subsided upon resting.  Over the last week, these episodes have increased frequency and are now occurring with minimal exertion.  On the day of presentation, the patient was having chest discomfort with activity such as getting dressed.  All of these episodes resolved within several minutes of resting.  He denied associated diaphoresis, palpitations, nausea, vomiting, or syncope.  He noticed trace lower extremity edema over the last few days.  Given his ongoing chest discomfort [facility].  There his initial labs were within normal limits.  EKG showed poor R wave progression, but no acute ischemic changes.  [facility] work-up and management.  Upon my interview, the patient is chest pain-free.  He has no first-degree relatives with any history of coronary artery disease.  He has not had any prior cardiac history.  He is a never smoker.  Home Meds:     The patient is unclear on his home medications.  These will have to be clarified in the a.m.  He is on an oral hypoglycemic, and one antihypertensive.  Allergies: Allergies not on file  Social History   Socioeconomic History  . Marital status: Single    Spouse name: Not on file  . Number of children: Not on file  . Years of education: Not on file  . Highest education level: Not on file    Occupational History  . Not on file  Social Needs  . Financial resource strain: Not on file  . Food insecurity:    Worry: Not on file    Inability: Not on file  . Transportation needs:    Medical: Not on file    Non-medical: Not on file  Tobacco Use  . Smoking status: Not on file  Substance and Sexual Activity  . Alcohol use: Not on file  . Drug use: Not on file  . Sexual activity: Not on file  Lifestyle  . Physical activity:    Days per week: Not on file    Minutes per session: Not on file  . Stress: Not on file  Relationships  . Social connections:    Talks on phone: Not on file    Gets together: Not on file    Attends religious service: Not on file    Active member of club or organization: Not on file    Attends meetings of clubs or organizations: Not on file    Relationship status: Not on file  . Intimate partner violence:    Fear of current or ex partner: Not on file    Emotionally abused: Not on file    Physically abused: Not on file    Forced sexual activity: Not on file  Other Topics Concern  . Not on file  Social History Narrative  . Not on file     No family history on file.  Review of Systems: All other systems reviewed and are otherwise negative except as noted above.  Labs: -CBC: 7.3 > 15.4/45.2 < 206 -BMP: 138/3.8  102/25  18/1.1 < 116 -Troponin I: 0.03 -LFTs, coags WNL  Lab Results  Component Value Date   CHOL (H) 01/23/2009    254        ATP III CLASSIFICATION:  <200     mg/dL   Desirable  161-096  mg/dL   Borderline High  >=045    mg/dL   High          HDL 34 (L) 01/23/2009   LDLCALC (H) 01/23/2009    169        Total Cholesterol/HDL:CHD Risk Coronary Heart Disease Risk Table                     Men   Women  1/2 Average Risk   3.4   3.3  Average Risk       5.0   4.4  2 X Average Risk   9.6   7.1  3 X Average Risk  23.4   11.0        Use the calculated Patient Ratio above and the CHD Risk Table to determine the patient's CHD  Risk.        ATP III CLASSIFICATION (LDL):  <100     mg/dL   Optimal  409-811  mg/dL   Near or Above                    Optimal  130-159  mg/dL   Borderline  914-782  mg/dL   High  >956     mg/dL   Very High   TRIG 213 (H) 01/23/2009   Lab Results  Component Value Date   DDIMER  01/22/2009    <0.22        AT THE INHOUSE ESTABLISHED CUTOFF VALUE OF 0.48 ug/mL FEU, THIS ASSAY HAS BEEN DOCUMENTED IN THE LITERATURE TO HAVE A SENSITIVITY AND NEGATIVE PREDICTIVE VALUE OF AT LEAST 98 TO 99%.  THE TEST RESULT SHOULD BE CORRELATED WITH AN ASSESSMENT OF THE CLINICAL PROBABILITY OF DVT / VTE.    Radiology/Studies:  No results found. Wt Readings from Last 3 Encounters:  No data found for Wt    EKG: NSR, PRWP with borderline Q waves in septal leads.  No priors for comparison.  Physical Exam: Pulse 65, temperature 98.8 F (37.1 C), temperature source Oral, resp. rate 18, SpO2 96 %. There is no height or weight on file to calculate BMI. General: Well developed, well nourished, in no acute distress. Head: Normocephalic, atraumatic, sclera non-icteric, no xanthomas, nares are without discharge.  Neck: Negative for carotid bruits. JVD not elevated. Lungs: Clear bilaterally to auscultation without wheezes, rales, or rhonchi. Breathing is unlabored. Heart: RRR with S1 S2. No murmurs, rubs, or gallops appreciated. Abdomen: Soft, non-tender, non-distended with normoactive bowel sounds. No hepatomegaly. No rebound/guarding. No obvious abdominal masses. Msk:  Strength and tone appear normal for age. Extremities: No clubbing or cyanosis. No edema.  Distal pedal pulses are 2+ and equal bilaterally. Neuro: Alert and oriented X 3. No focal deficit. No facial asymmetry. Moves all extremities spontaneously. Psych:  Responds to questions appropriately with a normal affect.    Assessment and Plan  57 year old man with hypertension, hyperlipidemia, and diabetes, who presents with a classic story  for unstable angina.  1.  Chest pain: Extremely concerning for unstable angina.  Will start  IV heparin drip.  Given high pretest probability, would be reasonable to proceed to cardiac catheterization upfront.  We will anticipate this in the am,  and cycle cardiac biomarkers in the interim.  Echocardiogram ordered.  2.  Hypertension: Unclear which antihypertensive he is taking at home.  This will have to be clarified in the morning.  Will monitor closely, and consider starting ACE inhibitor if significantly hypertensive overnight.  3.  Diabetes: On an oral agent currently.  We will hold this, and substitute sliding scale for glycemic control.  Will check A1c.  4.  Hyperlipidemia: In the context of likely coronary artery disease, we will start high intensity atorvastatin.  Check lipid panel in the a.m.  Prior lipid panel show LDLs in the 160s.    Signed, Esmond Plants, MD 05/17/2018, 11:47 PM

## 2018-05-18 ENCOUNTER — Observation Stay (HOSPITAL_COMMUNITY): Payer: BLUE CROSS/BLUE SHIELD

## 2018-05-18 ENCOUNTER — Other Ambulatory Visit: Payer: Self-pay | Admitting: *Deleted

## 2018-05-18 ENCOUNTER — Other Ambulatory Visit: Payer: Self-pay

## 2018-05-18 ENCOUNTER — Encounter (HOSPITAL_COMMUNITY)
Admission: AD | Disposition: A | Discharge status: Home / Self Care | Payer: Self-pay | Source: Other Acute Inpatient Hospital | Attending: Surgery

## 2018-05-18 ENCOUNTER — Encounter (HOSPITAL_COMMUNITY): Payer: Self-pay | Admitting: Emergency Medicine

## 2018-05-18 ENCOUNTER — Observation Stay (HOSPITAL_BASED_OUTPATIENT_CLINIC_OR_DEPARTMENT_OTHER): Payer: BLUE CROSS/BLUE SHIELD

## 2018-05-18 DIAGNOSIS — J9811 Atelectasis: Secondary | ICD-10-CM | POA: Diagnosis not present

## 2018-05-18 DIAGNOSIS — I2510 Atherosclerotic heart disease of native coronary artery without angina pectoris: Secondary | ICD-10-CM

## 2018-05-18 DIAGNOSIS — I214 Non-ST elevation (NSTEMI) myocardial infarction: Principal | ICD-10-CM

## 2018-05-18 DIAGNOSIS — R079 Chest pain, unspecified: Secondary | ICD-10-CM | POA: Diagnosis not present

## 2018-05-18 DIAGNOSIS — D62 Acute posthemorrhagic anemia: Secondary | ICD-10-CM | POA: Diagnosis not present

## 2018-05-18 DIAGNOSIS — Z6841 Body Mass Index (BMI) 40.0 and over, adult: Secondary | ICD-10-CM | POA: Diagnosis not present

## 2018-05-18 DIAGNOSIS — Z01810 Encounter for preprocedural cardiovascular examination: Secondary | ICD-10-CM | POA: Diagnosis not present

## 2018-05-18 DIAGNOSIS — I25110 Atherosclerotic heart disease of native coronary artery with unstable angina pectoris: Secondary | ICD-10-CM | POA: Diagnosis not present

## 2018-05-18 HISTORY — PX: LEFT HEART CATH AND CORONARY ANGIOGRAPHY: CATH118249

## 2018-05-18 LAB — BASIC METABOLIC PANEL
Anion gap: 16 — ABNORMAL HIGH (ref 5–15)
BUN: 12 mg/dL (ref 6–20)
CO2: 21 mmol/L — ABNORMAL LOW (ref 22–32)
Calcium: 9 mg/dL (ref 8.9–10.3)
Chloride: 103 mmol/L (ref 98–111)
Creatinine, Ser: 1.09 mg/dL (ref 0.61–1.24)
GFR calc Af Amer: >60 mL/min (ref >60)
GFR calc non Af Amer: >60 mL/min (ref >60)
Glucose, Bld: 126 mg/dL — ABNORMAL HIGH (ref 70–99)
Potassium: 3.8 mmol/L (ref 3.5–5.1)
Sodium: 140 mmol/L (ref 135–145)

## 2018-05-18 LAB — SPIROMETRY WITH GRAPH
FEF 25-75 Pre: 2.79 L/sec
FEF2575-%Pred-Pre: 92 %
FEV1-%Pred-Pre: 70 %
FEV1-Pre: 2.52 L
FEV1FVC-%Pred-Pre: 110 %
FEV6-%Pred-Pre: 66 %
FEV6-Pre: 2.99 L
FEV6FVC-%Pred-Pre: 104 %
FVC-%Pred-Pre: 63 %
FVC-Pre: 2.99 L
Pre FEV1/FVC ratio: 84 %
Pre FEV6/FVC Ratio: 100 %

## 2018-05-18 LAB — MRSA PCR SCREENING: MRSA by PCR: NEGATIVE

## 2018-05-18 LAB — CBC
HCT: 45.2 % (ref 39.0–52.0)
Hemoglobin: 14.5 g/dL (ref 13.0–17.0)
MCH: 29 pg (ref 26.0–34.0)
MCHC: 32.1 g/dL (ref 30.0–36.0)
MCV: 90.4 fL (ref 78.0–100.0)
Platelets: 223 10*3/uL (ref 150–400)
RBC: 5 MIL/uL (ref 4.22–5.81)
RDW: 13.2 % (ref 11.5–15.5)
WBC: 7.6 10*3/uL (ref 4.0–10.5)

## 2018-05-18 LAB — PROTIME-INR
INR: 1.06
Prothrombin Time: 13.8 seconds (ref 11.4–15.2)

## 2018-05-18 LAB — GLUCOSE, CAPILLARY
GLUCOSE-CAPILLARY: 140 mg/dL — AB (ref 70–99)
GLUCOSE-CAPILLARY: 120 mg/dL — AB (ref 70–99)
Glucose-Capillary: 100 mg/dL — ABNORMAL HIGH (ref 70–99)
Glucose-Capillary: 132 mg/dL — ABNORMAL HIGH (ref 70–99)
Glucose-Capillary: 155 mg/dL — ABNORMAL HIGH (ref 70–99)

## 2018-05-18 LAB — HEMOGLOBIN A1C
Hgb A1c MFr Bld: 7.9 % — ABNORMAL HIGH (ref 4.8–5.6)
Mean Plasma Glucose: 180.03 mg/dL

## 2018-05-18 LAB — TYPE AND SCREEN
ABO/RH(D): O NEG
ANTIBODY SCREEN: NEGATIVE

## 2018-05-18 LAB — ABO/RH: ABO/RH(D): O NEG

## 2018-05-18 LAB — ECHOCARDIOGRAM COMPLETE
Height: 69 in
Weight: 4373.93 oz

## 2018-05-18 LAB — LIPID PANEL
Cholesterol: 240 mg/dL — ABNORMAL HIGH (ref 0–200)
HDL: 40 mg/dL — ABNORMAL LOW (ref >40)
LDL Cholesterol: 141 mg/dL — ABNORMAL HIGH (ref 0–99)
Total CHOL/HDL Ratio: 6 RATIO
Triglycerides: 296 mg/dL — ABNORMAL HIGH (ref <150)
VLDL: 59 mg/dL — ABNORMAL HIGH (ref 0–40)

## 2018-05-18 LAB — HIV ANTIBODY (ROUTINE TESTING W REFLEX): HIV Screen 4th Generation wRfx: NONREACTIVE

## 2018-05-18 LAB — TROPONIN I
Troponin I: 0.04 ng/mL (ref <0.03)
Troponin I: 0.03 ng/mL (ref <0.03)
Troponin I: <0.03 ng/mL (ref <0.03)

## 2018-05-18 SURGERY — LEFT HEART CATH AND CORONARY ANGIOGRAPHY
Anesthesia: LOCAL

## 2018-05-18 MED ORDER — NITROGLYCERIN 1 MG/10 ML FOR IR/CATH LAB
INTRA_ARTERIAL | Status: AC
  Filled 2018-05-18: qty 10

## 2018-05-18 MED ORDER — SODIUM CHLORIDE 0.9 % IV SOLN
750.0000 mg | INTRAVENOUS | Status: DC
  Filled 2018-05-18: qty 750

## 2018-05-18 MED ORDER — SODIUM CHLORIDE 0.9 % IV SOLN: 250.0000 mL | INTRAVENOUS | Status: DC | PRN

## 2018-05-18 MED ORDER — ONDANSETRON HCL 4 MG/2ML IJ SOLN: 4.0000 mg | Freq: Four times a day (QID) | INTRAMUSCULAR | Status: DC | PRN

## 2018-05-18 MED ORDER — NITROGLYCERIN IN D5W 200-5 MCG/ML-% IV SOLN
2.0000 ug/min | INTRAVENOUS | Status: DC
  Filled 2018-05-18: qty 250

## 2018-05-18 MED ORDER — DOPAMINE-DEXTROSE 3.2-5 MG/ML-% IV SOLN
0.0000 ug/kg/min | INTRAVENOUS | Status: DC
  Filled 2018-05-18: qty 250

## 2018-05-18 MED ORDER — NITROGLYCERIN IN D5W 200-5 MCG/ML-% IV SOLN
INTRAVENOUS | Status: AC
  Filled 2018-05-18: qty 250

## 2018-05-18 MED ORDER — PLASMA-LYTE 148 IV SOLN
INTRAVENOUS | Status: AC
  Administered 2018-05-19: 500 mL
  Filled 2018-05-18: qty 2.5

## 2018-05-18 MED ORDER — HEPARIN (PORCINE) IN NACL 2-0.9 UNITS/ML
INTRAMUSCULAR | Status: DC | PRN
  Administered 2018-05-18: 10 mL via INTRA_ARTERIAL

## 2018-05-18 MED ORDER — SODIUM CHLORIDE 0.9 % WEIGHT BASED INFUSION: 3.0000 mL/kg/h | INTRAVENOUS | Status: AC

## 2018-05-18 MED ORDER — SODIUM CHLORIDE 0.9% FLUSH: 3.0000 mL | INTRAVENOUS | Status: DC | PRN

## 2018-05-18 MED ORDER — SODIUM CHLORIDE 0.9 % IV SOLN
INTRAVENOUS | Status: DC
  Filled 2018-05-18: qty 30

## 2018-05-18 MED ORDER — MILRINONE LACTATE IN DEXTROSE 20-5 MG/100ML-% IV SOLN
0.1250 ug/kg/min | INTRAVENOUS | Status: DC
  Filled 2018-05-18: qty 100

## 2018-05-18 MED ORDER — SODIUM CHLORIDE 0.9 % IV SOLN
1500.0000 mg | INTRAVENOUS | Status: AC
  Administered 2018-05-19: 1500 mg via INTRAVENOUS
  Filled 2018-05-18: qty 1500

## 2018-05-18 MED ORDER — HEPARIN (PORCINE) IN NACL 100-0.45 UNIT/ML-% IJ SOLN
1500.0000 [IU]/h | INTRAMUSCULAR | Status: DC
  Administered 2018-05-18 – 2018-05-19 (×2): 1500 [IU]/h via INTRAVENOUS
  Filled 2018-05-18: qty 250

## 2018-05-18 MED ORDER — IOPAMIDOL (ISOVUE-370) INJECTION 76%
INTRAVENOUS | Status: AC
  Filled 2018-05-18: qty 50

## 2018-05-18 MED ORDER — HEPARIN (PORCINE) IN NACL 1000-0.9 UT/500ML-% IV SOLN
INTRAVENOUS | Status: AC
  Filled 2018-05-18: qty 1000

## 2018-05-18 MED ORDER — SODIUM CHLORIDE 0.9% FLUSH: 3.0000 mL | Freq: Two times a day (BID) | INTRAVENOUS | Status: DC

## 2018-05-18 MED ORDER — LIDOCAINE HCL (PF) 1 % IJ SOLN
INTRAMUSCULAR | Status: DC | PRN
  Administered 2018-05-18: 2 mL

## 2018-05-18 MED ORDER — TEMAZEPAM 15 MG PO CAPS: 15.0000 mg | ORAL_CAPSULE | Freq: Once | ORAL | Status: DC | PRN

## 2018-05-18 MED ORDER — IOPAMIDOL (ISOVUE-370) INJECTION 76%
INTRAVENOUS | Status: AC
  Filled 2018-05-18: qty 100

## 2018-05-18 MED ORDER — TRANEXAMIC ACID (OHS) BOLUS VIA INFUSION
15.0000 mg/kg | INTRAVENOUS | Status: AC
  Administered 2018-05-19: 1860 mg via INTRAVENOUS
  Filled 2018-05-18: qty 1860

## 2018-05-18 MED ORDER — TRANEXAMIC ACID 1000 MG/10ML IV SOLN
1.5000 mg/kg/h | INTRAVENOUS | Status: AC
  Administered 2018-05-19: 1.5 mg/kg/h via INTRAVENOUS
  Filled 2018-05-18: qty 25

## 2018-05-18 MED ORDER — HEPARIN SODIUM (PORCINE) 1000 UNIT/ML IJ SOLN
INTRAMUSCULAR | Status: AC
  Filled 2018-05-18: qty 1

## 2018-05-18 MED ORDER — FENTANYL CITRATE (PF) 100 MCG/2ML IJ SOLN
INTRAMUSCULAR | Status: AC
  Filled 2018-05-18: qty 2

## 2018-05-18 MED ORDER — DEXMEDETOMIDINE HCL IN NACL 400 MCG/100ML IV SOLN
0.1000 ug/kg/h | INTRAVENOUS | Status: AC
  Administered 2018-05-19: .2 ug/kg/h via INTRAVENOUS
  Filled 2018-05-18: qty 100

## 2018-05-18 MED ORDER — CHLORHEXIDINE GLUCONATE 0.12 % MT SOLN
15.0000 mL | Freq: Once | OROMUCOSAL | Status: AC
  Administered 2018-05-19: 15 mL via OROMUCOSAL
  Filled 2018-05-18: qty 15

## 2018-05-18 MED ORDER — HEPARIN SODIUM (PORCINE) 1000 UNIT/ML IJ SOLN
INTRAMUSCULAR | Status: DC | PRN
  Administered 2018-05-18: 6000 [IU] via INTRAVENOUS

## 2018-05-18 MED ORDER — POTASSIUM CHLORIDE 2 MEQ/ML IV SOLN
80.0000 meq | INTRAVENOUS | Status: DC
Start: 2018-05-19 — End: 2018-05-19
  Filled 2018-05-18: qty 40

## 2018-05-18 MED ORDER — ALPRAZOLAM 0.25 MG PO TABS
0.2500 mg | ORAL_TABLET | ORAL | Status: DC | PRN
  Administered 2018-05-18: 0.5 mg via ORAL
  Filled 2018-05-18: qty 2

## 2018-05-18 MED ORDER — MORPHINE SULFATE (PF) 2 MG/ML IV SOLN
2.0000 mg | Freq: Once | INTRAVENOUS | Status: AC
  Administered 2018-05-18: 2 mg via INTRAVENOUS
  Filled 2018-05-18: qty 1

## 2018-05-18 MED ORDER — METOPROLOL TARTRATE 12.5 MG HALF TABLET
12.5000 mg | ORAL_TABLET | Freq: Once | ORAL | Status: AC
  Administered 2018-05-19: 12.5 mg via ORAL
  Filled 2018-05-18: qty 1

## 2018-05-18 MED ORDER — HEPARIN BOLUS VIA INFUSION
4000.0000 [IU] | Freq: Once | INTRAVENOUS | Status: AC
  Administered 2018-05-18: 4000 [IU] via INTRAVENOUS
  Filled 2018-05-18: qty 4000

## 2018-05-18 MED ORDER — NITROGLYCERIN 1 MG/10 ML FOR IR/CATH LAB
INTRA_ARTERIAL | Status: DC | PRN
  Administered 2018-05-18: 200 ug via INTRACORONARY

## 2018-05-18 MED ORDER — DEXTROSE 5 % IV SOLN
0.0000 ug/min | INTRAVENOUS | Status: DC
  Filled 2018-05-18: qty 4

## 2018-05-18 MED ORDER — MIDAZOLAM HCL 2 MG/2ML IJ SOLN
INTRAMUSCULAR | Status: DC | PRN
  Administered 2018-05-18: 1 mg via INTRAVENOUS

## 2018-05-18 MED ORDER — CHLORHEXIDINE GLUCONATE CLOTH 2 % EX PADS
6.0000 | MEDICATED_PAD | Freq: Once | CUTANEOUS | Status: AC
  Administered 2018-05-18: 6 via TOPICAL

## 2018-05-18 MED ORDER — MAGNESIUM SULFATE 50 % IJ SOLN
40.0000 meq | INTRAMUSCULAR | Status: DC
  Filled 2018-05-18: qty 9.85

## 2018-05-18 MED ORDER — LIDOCAINE HCL (PF) 1 % IJ SOLN
INTRAMUSCULAR | Status: AC
  Filled 2018-05-18: qty 30

## 2018-05-18 MED ORDER — VERAPAMIL HCL 2.5 MG/ML IV SOLN
INTRAVENOUS | Status: AC
  Filled 2018-05-18: qty 2

## 2018-05-18 MED ORDER — ASPIRIN 81 MG PO CHEW: 81.0000 mg | CHEWABLE_TABLET | ORAL | Status: AC

## 2018-05-18 MED ORDER — FENTANYL CITRATE (PF) 100 MCG/2ML IJ SOLN
INTRAMUSCULAR | Status: DC | PRN
  Administered 2018-05-18: 50 ug via INTRAVENOUS

## 2018-05-18 MED ORDER — SODIUM CHLORIDE 0.9 % IV SOLN
1.5000 g | INTRAVENOUS | Status: AC
  Administered 2018-05-19: 1.5 g via INTRAVENOUS
  Filled 2018-05-18: qty 1.5

## 2018-05-18 MED ORDER — TRANEXAMIC ACID (OHS) PUMP PRIME SOLUTION
2.0000 mg/kg | INTRAVENOUS | Status: DC
  Filled 2018-05-18: qty 2.48

## 2018-05-18 MED ORDER — NITROGLYCERIN IN D5W 200-5 MCG/ML-% IV SOLN
10.0000 ug/min | INTRAVENOUS | Status: DC
  Administered 2018-05-18: 5 ug/min via INTRAVENOUS
  Administered 2018-05-18: 20 ug/min via INTRAVENOUS

## 2018-05-18 MED ORDER — CHLORHEXIDINE GLUCONATE CLOTH 2 % EX PADS
6.0000 | MEDICATED_PAD | Freq: Once | CUTANEOUS | Status: AC
  Administered 2018-05-19: 6 via TOPICAL

## 2018-05-18 MED ORDER — BISACODYL 5 MG PO TBEC
5.0000 mg | DELAYED_RELEASE_TABLET | Freq: Once | ORAL | Status: AC
  Administered 2018-05-18: 5 mg via ORAL
  Filled 2018-05-18: qty 1

## 2018-05-18 MED ORDER — IOPAMIDOL (ISOVUE-370) INJECTION 76%
INTRAVENOUS | Status: DC | PRN
  Administered 2018-05-18: 110 mL via INTRA_ARTERIAL

## 2018-05-18 MED ORDER — SODIUM CHLORIDE 0.9 % IV SOLN
30.0000 ug/min | INTRAVENOUS | Status: DC
  Filled 2018-05-18: qty 2

## 2018-05-18 MED ORDER — SODIUM CHLORIDE 0.9 % IV SOLN: INTRAVENOUS | Status: AC

## 2018-05-18 MED ORDER — OXYCODONE HCL 5 MG PO TABS: 5.0000 mg | ORAL_TABLET | ORAL | Status: DC | PRN

## 2018-05-18 MED ORDER — ACETAMINOPHEN 325 MG PO TABS: 650.0000 mg | ORAL_TABLET | ORAL | Status: DC | PRN

## 2018-05-18 MED ORDER — SODIUM CHLORIDE 0.9 % IV SOLN
INTRAVENOUS | Status: AC
  Administered 2018-05-19: 1.9 [IU]/h via INTRAVENOUS
  Filled 2018-05-18: qty 1

## 2018-05-18 MED ORDER — HEPARIN (PORCINE) IN NACL 100-0.45 UNIT/ML-% IJ SOLN
1400.0000 [IU]/h | INTRAMUSCULAR | Status: DC
  Administered 2018-05-18: 1400 [IU]/h via INTRAVENOUS
  Filled 2018-05-18: qty 250

## 2018-05-18 MED ORDER — ASPIRIN 81 MG PO CHEW: 81.0000 mg | CHEWABLE_TABLET | Freq: Every day | ORAL | Status: DC

## 2018-05-18 MED ORDER — DIAZEPAM 5 MG PO TABS
5.0000 mg | ORAL_TABLET | Freq: Once | ORAL | Status: AC
  Administered 2018-05-19: 5 mg via ORAL
  Filled 2018-05-18: qty 1

## 2018-05-18 MED ORDER — HEPARIN (PORCINE) IN NACL 2-0.9 UNITS/ML
INTRAMUSCULAR | Status: AC | PRN
  Administered 2018-05-18 (×3): 500 mL via INTRA_ARTERIAL

## 2018-05-18 MED ORDER — MIDAZOLAM HCL 2 MG/2ML IJ SOLN
INTRAMUSCULAR | Status: AC
  Filled 2018-05-18: qty 2

## 2018-05-18 MED ORDER — NITROGLYCERIN IN D5W 200-5 MCG/ML-% IV SOLN
INTRAVENOUS | Status: AC | PRN
  Administered 2018-05-18: 10 ug/min via INTRAVENOUS

## 2018-05-18 MED ORDER — SODIUM CHLORIDE 0.9 % WEIGHT BASED INFUSION
1.0000 mL/kg/h | INTRAVENOUS | Status: DC
  Administered 2018-05-18: 1 mL/kg/h via INTRAVENOUS

## 2018-05-18 SURGICAL SUPPLY — 13 items
CATH INFINITI 5 FR JL3.5 (CATHETERS) ×2 IMPLANT
CATH INFINITI JR4 5F (CATHETERS) ×2 IMPLANT
CATH LAUNCHER 5F EBU3.5 (CATHETERS) ×2 IMPLANT
COVER PRB 48X5XTLSCP FOLD TPE (BAG) ×1 IMPLANT
COVER PROBE 5X48 (BAG) ×1
DEVICE RAD COMP TR BAND LRG (VASCULAR PRODUCTS) ×2 IMPLANT
GLIDESHEATH SLEND A-KIT 6F 22G (SHEATH) ×2 IMPLANT
GUIDEWIRE INQWIRE 1.5J.035X260 (WIRE) ×1 IMPLANT
INQWIRE 1.5J .035X260CM (WIRE) ×2
KIT HEART LEFT (KITS) ×2 IMPLANT
PACK CARDIAC CATHETERIZATION (CUSTOM PROCEDURE TRAY) ×2 IMPLANT
TRANSDUCER W/STOPCOCK (MISCELLANEOUS) ×2 IMPLANT
TUBING CIL FLEX 10 FLL-RA (TUBING) ×2 IMPLANT

## 2018-05-18 NOTE — Progress Notes (Addendum)
The patient has been seen in conjunction with Laverda Page, NP. All aspects of care have been considered and discussed. The patient has been personally interviewed, examined, and all clinical data has been reviewed.   Clinically stable but having angina with minimal activity.  Progress Note  Patient Name: Nicholas Chambers Date of Encounter: 05/18/2018  Primary Cardiologist: No primary care provider on file.  Subjective   Did have some recurrent chest pain while getting up to the bathroom this morning.   Inpatient Medications    Scheduled Meds: . aspirin EC  81 mg Oral Daily  . atorvastatin  80 mg Oral q1800  . insulin aspart  0-15 Units Subcutaneous TID WC  . insulin aspart  0-5 Units Subcutaneous QHS   Continuous Infusions: . heparin 1,400 Units/hr (05/18/18 0300)   PRN Meds: acetaminophen, nitroGLYCERIN, ondansetron (ZOFRAN) IV   Vital Signs    Vitals:   05/18/18 0410 05/18/18 0411 05/18/18 0444 05/18/18 0736  BP:  (!) 92/48 (!) 102/52 106/66  Pulse:  (!) 59  61  Resp:  16  18  Temp:  98 F (36.7 C)  98.1 F (36.7 C)  TempSrc:  Oral  Oral  SpO2:  98%  97%  Weight: 273 lb 9.6 oz (124.1 kg)       Intake/Output Summary (Last 24 hours) at 05/18/2018 0954 Last data filed at 05/18/2018 1478 Gross per 24 hour  Intake 299.57 ml  Output -  Net 299.57 ml   Filed Weights   05/18/18 0410  Weight: 273 lb 9.6 oz (124.1 kg)    Telemetry    SR - Personally Reviewed  ECG    SR with TWI in lateral leads, and q wave in lead III - Personally Reviewed  Physical Exam   General: Well developed, well nourished, obese W male appearing in no acute distress. Head: Normocephalic, atraumatic.  Neck: Supple, no JVD. Lungs:  Resp regular and unlabored, CTA. Heart: RRR, S1, S2, no S3, S4, or murmur; no rub. Abdomen: Soft, non-tender, non-distended with normoactive bowel sounds. Extremities: No clubbing, cyanosis, edema. Distal pedal pulses are 2+ bilaterally. Neuro:  Alert and oriented X 3. Moves all extremities spontaneously. Psych: Normal affect.  Labs    Chemistry Recent Labs  Lab 05/18/18 0524  NA 140  K 3.8  CL 103  CO2 21*  GLUCOSE 126*  BUN 12  CREATININE 1.09  CALCIUM 9.0  GFRNONAA >60  GFRAA >60  ANIONGAP 16*     Hematology Recent Labs  Lab 05/18/18 0524  WBC 7.6  RBC 5.00  HGB 14.5  HCT 45.2  MCV 90.4  MCH 29.0  MCHC 32.1  RDW 13.2  PLT 223    Cardiac Enzymes Recent Labs  Lab 05/18/18 0028 05/18/18 0524  TROPONINI 0.04* 0.03*   No results for input(s): TROPIPOC in the last 168 hours.   BNPNo results for input(s): BNP, PROBNP in the last 168 hours.   DDimer No results for input(s): DDIMER in the last 168 hours.    Radiology    No results found.  Cardiac Studies   N/a   Patient Profile     57 y.o. male with PMH of HTN, HL, DM and obesity who presented from [facility] with unstable angina and mildly elevated troponin.   Assessment & Plan    1.  Chest pain: Symptoms are concerning for unstable angina.  Remains on IV heparin drip. Did have recurrent chest pain with walking to the bathroom this morning. CRFs include  HTN, HL, DM. Given symptoms will plan for cardiac cath today.  -- The patient understands that risks included but are not limited to stroke (1 in 1000), death (1 in 1000), kidney failure [usually temporary] (1 in 500), bleeding (1 in 200), allergic reaction [possibly serious] (1 in 200).   2.  Hypertension: Reports being on antihypertensives at home, but he is unsure of which ones. Wife planning to bring his medication list today. Blood pressure is controlled at this time without need for medication.   3.  Diabetes: On an oral agent currently, which is being held.  -- on SSI -- Hgb A1c 7.9  4.  Hyperlipidemia: Reports being on a statin, but not sure what. Has been tried on several statins in the past with severe myaglias. LDL 141 on admission. Lipitor 80mg  started on admission. May need to  consider PCSK9s as an outpatient.    Signed, Laverda PageLindsay Roberts, NP  05/18/2018, 9:54 AM  Pager # [phone]   For questions or updates, please contact [facility] Please consult [email or web address] for contact info under Cardiology/STEMI.

## 2018-05-18 NOTE — Progress Notes (Signed)
Vascular prelim:   Right Carotid:Velocities in the right ICA are consistent with a 40-59% stenosis.  Left Carotid: Velocities in the left ICA are consistent with a 40-59% stenosis.    Right ABI: Pedal artery waveforms within normal limits.  Left ABI: Pedal artery waveforms within normal limits.    Right Upper Extremity: Allen's test not obtained due to TR band.  Left Upper Extremity: Doppler waveforms remain within normal limits with left radial compression. Doppler waveforms remain within normal limits with left ulnar compression.    Farrel DemarkJill Eunice, RDMS, RVT

## 2018-05-18 NOTE — Progress Notes (Signed)
ANTICOAGULATION CONSULT NOTE  Pharmacy Consult for Heparin  Indication: chest pain/ACS  Not on File  Patient Measurements: Wt: 124 kg  Vital Signs: Temp: 98.4 F (36.9 C) (07/09 1159) Temp Source: Oral (07/09 1159) BP: 105/68 (07/09 1235) Pulse Rate: 62 (07/09 1235)   Assessment: 57 y/o M transfer from outside facility with unstable angina. Received morphine and nitro paste, no anti-coagulation was started.   S/p cath, heparin to resume at 8 pm tonight, pending CVTS consult   Goal of Therapy:  Heparin level 0.3-0.7 units/ml Monitor platelets by anticoagulation protocol: Yes   Plan:  Heparin at 1500 units / hr starting at 2000 pm Daily CBC/HL Monitor for bleeding  Thank you Okey RegalLisa Brodie Scovell, PharmD [phone] 05/18/2018,1:01 PM

## 2018-05-18 NOTE — Anesthesia Preprocedure Evaluation (Addendum)
Anesthesia Evaluation  Patient identified by MRN, date of birth, ID band Patient awake    Reviewed: Allergy & Precautions, NPO status , Patient's Chart, lab work & pertinent test results, reviewed documented beta blocker date and time   Airway Mallampati: IV  TM Distance: >3 FB Neck ROM: Full    Dental  (+) Chipped,    Pulmonary neg pulmonary ROS,    Pulmonary exam normal breath sounds clear to auscultation       Cardiovascular hypertension, Pt. on medications + CAD  Normal cardiovascular exam Rhythm:Regular Rate:Normal  ECG: NSR, rate 70  ECHO: Left ventricle: The cavity size was normal. Wall thickness was normal. Systolic function was normal. The estimated ejection fraction was in the range of 55% to 60%. Although no diagnostic regional wall motion abnormality was identified, this possibility cannot be completely excluded on the basis of this study. Left ventricular diastolic function parameters were normal. Left atrium: The atrium was mildly dilated.  CATH: Severe three-vessel coronary artery disease. Total occlusion of the mid LAD, filling late via collaterals from the right coronary.  The first diagonal contains proximal to mid eccentric 70% narrowing.   Severe obstruction in the dominant second obtuse marginal.   Severe multifocal disease in PDA and large left ventricular branch. Overall normal LV function with normal LVEDP.  EF 55%.    Neuro/Psych negative neurological ROS  negative psych ROS   GI/Hepatic negative GI ROS, Neg liver ROS,   Endo/Other  diabetes, Oral Hypoglycemic AgentsHypothyroidism Morbid obesity  Renal/GU negative Renal ROS     Musculoskeletal negative musculoskeletal ROS (+)   Abdominal (+) + obese,   Peds  Hematology negative hematology ROS (+)   Anesthesia Other Findings SEVERE CAD  Reproductive/Obstetrics                           Anesthesia  Physical Anesthesia Plan  ASA: IV  Anesthesia Plan: General   Post-op Pain Management:    Induction: Intravenous  PONV Risk Score and Plan: 2 and Midazolam and Treatment may vary due to age or medical condition  Airway Management Planned: Oral ETT  Additional Equipment: Arterial line, CVP, PA Cath, TEE and Ultrasound Guidance Line Placement  Intra-op Plan:   Post-operative Plan: Post-operative intubation/ventilation  Informed Consent: I have reviewed the patients History and Physical, chart, labs and discussed the procedure including the risks, benefits and alternatives for the proposed anesthesia with the patient or authorized representative who has indicated his/her understanding and acceptance.   Dental advisory given  Plan Discussed with: CRNA  Anesthesia Plan Comments:         Anesthesia Quick Evaluation

## 2018-05-18 NOTE — Plan of Care (Signed)
  Problem: Education: Goal: Knowledge of General Education information will improve Outcome: Progressing   

## 2018-05-18 NOTE — Progress Notes (Signed)
  Echocardiogram 2D Echocardiogram has been performed.  Nicholas SkeenVijay  Shade Chambers 05/18/2018, 3:17 PM

## 2018-05-18 NOTE — Interval H&P Note (Signed)
Cath Lab Visit (complete for each Cath Lab visit)  Clinical Evaluation Leading to the Procedure:   ACS: Yes.    Non-ACS:    Anginal Classification: CCS IV  Anti-ischemic medical therapy: Minimal Therapy (1 class of medications)  Non-Invasive Test Results: No non-invasive testing performed  Prior CABG: No previous CABG      History and Physical Interval Note:  05/18/2018 10:26 AM  Elisabeth MostGregory D Rother  has presented today for surgery, with the diagnosis of cp  The various methods of treatment have been discussed with the patient and family. After consideration of risks, benefits and other options for treatment, the patient has consented to  Procedure(s): LEFT HEART CATH AND CORONARY ANGIOGRAPHY (N/A) as a surgical intervention .  The patient's history has been reviewed, patient examined, no change in status, stable for surgery.  I have reviewed the patient's chart and labs.  Questions were answered to the patient's satisfaction.     Lyn RecordsHenry W Quartez Lag[place] III

## 2018-05-18 NOTE — Progress Notes (Signed)
ANTICOAGULATION CONSULT NOTE - Initial Consult  Pharmacy Consult for Heparin  Indication: chest pain/ACS  Allergies not on file  Patient Measurements: Wt: 124 kg  Vital Signs: Temp: 98.5 F (36.9 C) (07/09 0007) Temp Source: Oral (07/09 0007) BP: 113/77 (07/09 0007) Pulse Rate: 62 (07/09 0007)  Labs from outside facility:  Hgb 15.4 INR 1 Plts 206 Scr 1.1   Assessment: 57 y/o M transfer from outside facility with unstable angina. Received morphine and nitro paste, no anti-coagulation was started.   Goal of Therapy:  Heparin level 0.3-0.7 units/ml Monitor platelets by anticoagulation protocol: Yes   Plan:  Heparin 4000 units BOLUS Start heparin drip at 1400 units/hr 0900 HL Daily CBC/HL Monitor for bleeding   Abran DukeLedford, Beulah Capobianco 05/18/2018,12:50 AM

## 2018-05-18 NOTE — Consult Note (Addendum)
[address]       [place] [address]             [phone]        URIAS SHEEK [facility] Medical Record #[id number] Date of Birth: 1961/06/30  Referring: Dr. Katrinka Blazing Primary Care: No primary care provider on file.  Chief Complaint: Chest pain Reason for consultation: S/p NSTEMI, severe multivessel CAD  History of Present Illness:     This is a 57 year old Caucasian male with a past medical history of hypertension, diabetes, hypothyroidism, obesity who presented to [facility] ED late in the evening of 05/17/2018 with complaints of substernal chest pain. This did radiate into his right neck and was associated with shortness of breath and nausea. These symptoms started approximately one week ago and seemed to occur with exertion. Recently, the symptoms have become more frequent and seem to occur with minimal exertion. The chest pain resolves after he rests.  Initial Troponin I was 0.04 and has not gone higher. EKG showed poor R wave progression but no acute ischemic changes. He was transferred to [facility] for further evaluation and management.  The patient states he just completed a  "breathing test" and he has substernal chest pain and a little shortness of breath. His BP was 111/60 and HR in the 70's while I was in the room. No obvious changes on the monitor. He was on a Nitro drip but that was stopped around 1415 as he had hypotension and a headache (according to the nurse). Heparin drip is to be restarted 8 hours after sheath removal (according to cardiology).   Current Activity/ Functional Status: Patient is independent with mobility/ambulation, transfers, ADL's, IADL's.   Zubrod Score: At the time of surgery this patient's most appropriate activity status/level should be described as: []     0    Normal activity, no symptoms [x]     1    Restricted in physical strenuous activity but ambulatory, able to do out light work []     2     Ambulatory and capable of self care, unable to do work activities, up and about                 more than 50%  Of the time                            []     3    Only limited self care, in bed greater than 50% of waking hours []     4    Completely disabled, no self care, confined to bed or chair []     5    Moribund  Past Medical History:  Diagnosis Date  . Diabetes mellitus (HCC)   . Hypertension   . Hyperthyroidism    Past Surgical History: Right shoulder surgery Left shoulder surgery x 2 Umbilcal hernia repair  Social History   Tobacco Use  Smoking Status Never Smoker  Smokeless Tobacco Never Used    Social History   Substance and Sexual Activity  Alcohol Use Never  . Frequency: Never  Occupation: office work, right hand dominant  Family History: Mother has hypertension Brother has diabetes  Allergies: NKDA  Current Facility-Administered Medications  Medication Dose Route Frequency Provider Last Rate Last Dose  . 0.9 %  sodium chloride infusion  250 mL Intravenous PRN Lyn Records, MD      . acetaminophen (  TYLENOL) tablet 650 mg  650 mg Oral Q4H PRN Chakravartti, Jaidip, MD      . aspirin EC tablet 81 mg  81 mg Oral Daily Chakravartti, Jaidip, MD   81 mg at 05/18/18 0848  . atorvastatin (LIPITOR) tablet 80 mg  80 mg Oral q1800 Chakravartti, Jaidip, MD      . heparin ADULT infusion 100 units/mL (25000 units/2450mL sodium chloride 0.45%)  1,500 Units/hr Intravenous Continuous Croitoru, Mihai, MD      . insulin aspart (novoLOG) injection 0-15 Units  0-15 Units Subcutaneous TID WC Chakravartti, Jaidip, MD   2 Units at 05/18/18 0849  . insulin aspart (novoLOG) injection 0-5 Units  0-5 Units Subcutaneous QHS Chakravartti, Jaidip, MD      . nitroGLYCERIN (NITROSTAT) SL tablet 0.4 mg  0.4 mg Sublingual Q5 Min x 3 PRN Chakravartti, Jaidip, MD      . nitroGLYCERIN 50 mg in dextrose 5 % 250 mL (0.2 mg/mL) infusion  10-80 mcg/min Intravenous Titrated Lyn RecordsSmith, Henry W, MD   Stopped at  05/18/18 1415  . ondansetron (ZOFRAN) injection 4 mg  4 mg Intravenous Q6H PRN Chakravartti, Jaidip, MD      . oxyCODONE (Oxy IR/ROXICODONE) immediate release tablet 5-10 mg  5-10 mg Oral Q4H PRN Lyn RecordsSmith, Henry W, MD      . sodium chloride flush (NS) 0.9 % injection 3 mL  3 mL Intravenous Q12H Lyn RecordsSmith, Henry W, MD      . sodium chloride flush (NS) 0.9 % injection 3 mL  3 mL Intravenous PRN Lyn RecordsSmith, Henry W, MD        Review of Systems:     Cardiac Review of Systems: Y or  [  N  ]= no  Chest Pain [ Y   ]  Resting SOB [ N  ] Exertional SOB  [Y  ]  Orthopnea [ N ]    Palpitations Klaus.Mock[N  ] Syncope  [ N ]   Presyncope Klaus.Mock[N   ]  General Review of Systems: [Y] = yes [N ]=no Constitional:  nausea [ Y ]; night sweats [ N ]; fever [N  ]; or chills Klaus.Mock[N  ]                                                                 Eye : blurred vision [ N ]; diplopia [ N  ]; vision changes Klaus.Mock[N  ];   Resp: cough [ N ];  wheezing[N  ];  hemoptysis[ N ];  GI:  vomiting[N  ];  dysphagia[ N ]; melena[ N ];  hematochezia Klaus.Mock[N  ];  GU:  hematuria[N  ];   dysuria [ N ];               Skin: rash, swelling[ N ];,   peripheral edema[ N ]; ; Musculosketetal: myalgias[ N ];   back pain[ ;  Heme/Lymph: bruising[N ]; anemia[N  ];  Neuro: TIA[ N ];    stroke[N  ]; seizures[ N ];     Endocrine: diabetes[ Y ];  thyroid dysfunction[  Y];             Physical Exam: BP 105/68   Pulse 62   Temp 98.4 F (36.9 C) (Oral)   Resp 18   Ht  5\' 9"  (1.753 m)   Wt 273 lb 5.9 oz (124 kg)   SpO2 97%   BMI 40.37 kg/m    General appearance: alert, cooperative and no distress Head: Normocephalic, without obvious abnormality, atraumatic Neck: no carotid bruit, no JVD and supple, symmetrical, trachea midline Resp: clear to auscultation bilaterally Cardio: RRR, no murmur GI: Soft, obese, non tender, sporadic bowel sounds present Extremities: SCDS in place;feet warm Neurologic: Grossly normal  Diagnostic Studies & Laboratory data:    Lyn Records, MD (Primary)    Procedures   LEFT HEART CATH AND CORONARY ANGIOGRAPHY on 05/18/2018:  Conclusion     Ost RPDA to RPDA lesion is 80% stenosed.  RPDA lesion is 90% stenosed.  Ost 2nd Mrg to 2nd Mrg lesion is 90% stenosed.  Prox Cx to Mid Cx lesion is 65% stenosed.  Ost 1st Diag lesion is 75% stenosed.  Prox LAD to Mid LAD lesion is 100% stenosed.    Severe three-vessel coronary artery disease.  Total occlusion of the mid LAD, filling late via collaterals from the right coronary.  The first diagonal contains proximal to mid eccentric 70% narrowing.    Severe obstruction in the dominant second obtuse marginal.    Severe multifocal disease in PDA and large left ventricular branch.  Overall normal LV function with normal LVEDP.  EF 55%.   100% of this time.  Coronary Findings   Diagnostic  Dominance: Right  Left Anterior Descending  Collaterals  Mid LAD filled by collaterals from RPDA.    Prox LAD to Mid LAD lesion 100% stenosed  Prox LAD to Mid LAD lesion is 100% stenosed.  First Diagonal Branch  Ost 1st Diag lesion 75% stenosed  Ost 1st Diag lesion is 75% stenosed.  Left Circumflex  Prox Cx to Mid Cx lesion 65% stenosed  Prox Cx to Mid Cx lesion is 65% stenosed.  Second Obtuse Marginal Branch  Ost 2nd Mrg to 2nd Mrg lesion 90% stenosed  Ost 2nd Mrg to 2nd Mrg lesion is 90% stenosed.  Right Coronary Artery  Right Posterior Descending Artery  Ost RPDA to RPDA lesion 80% stenosed  Ost RPDA to RPDA lesion is 80% stenosed.  RPDA lesion 90% stenosed  RPDA lesion is 90% stenosed.  Intervention   No interventions have been documented.  Wall Motion   Resting               Coronary Diagrams   Diagnostic Diagram           Recent Radiology Findings:   No results found.   I have independently reviewed the above radiologic studies and discussed with the patient   Recent Lab Findings: Lab Results  Component Value Date   WBC 7.6 05/18/2018    HGB 14.5 05/18/2018   HCT 45.2 05/18/2018   PLT 223 05/18/2018   GLUCOSE 126 (H) 05/18/2018   CHOL 240 (H) 05/18/2018   TRIG 296 (H) 05/18/2018   HDL 40 (L) 05/18/2018   LDLCALC 141 (H) 05/18/2018   ALT 26 01/22/2009   AST 22 01/22/2009   NA 140 05/18/2018   K 3.8 05/18/2018   CL 103 05/18/2018   CREATININE 1.09 05/18/2018   BUN 12 05/18/2018   CO2 21 (L) 05/18/2018   TSH 1.794 Test methodology is 3rd generation TSH 01/22/2009   INR 1.06 05/18/2018   HGBA1C 7.9 (H) 05/18/2018    Assessment / Plan:   1. S/p NSTEMI-severe coronary artery disease. He will benefit from coronary artery bypass  grafting surgery. He will be scheduled for am case with Dr. Laneta Simmers. We may be able to use left radial artery, if needed. Nurse has been notified that he was having substernal chest pain and to notify cardiology.  Heparin drip to be restarted 8 hours after sheath pulled, according to cardiology. 2. Hypertension-He was on Lisinopril 10 mg daily prior to admission. Will be started on BB pre op. 3. Diabetes mellitus- He was on Glimepiride 2 mg daily prior to admission.HGA1C 7.9. He will need close medical follow up after discharge. 4. Hyperlipidemia-He was on Pitavastatin 2 mg daily prior to surgery. He has been started on Atorvastatin 80 mg at hs. 5. Hypothyroidsim-will restart Levothyroxine 50 mcg daily as taken prior to surgery post op. Will check TSH with am labs  I  spent 20 minutes counseling the patient face to face.   Doree Fudge PA-C 05/18/2018 4:16 PM  Medical record and cath films, echo reviewed. He has severe 3-vessel coronary artery disease with preserved LV systolic function and has recently progressive unstable angina. I agree that CABG is the best treatment for him. I discussed the operative procedure with the patient including alternatives, benefits and risks; including but not limited to bleeding, blood transfusion, infection, stroke, myocardial infarction, graft failure, heart  block requiring a permanent pacemaker, organ dysfunction, and death.  [name] understands and agrees to proceed.  We will schedule surgery for tomorrow morning.  Alleen Borne, MD.

## 2018-05-19 ENCOUNTER — Observation Stay (HOSPITAL_COMMUNITY): Payer: BLUE CROSS/BLUE SHIELD | Admitting: Anesthesiology

## 2018-05-19 ENCOUNTER — Encounter (HOSPITAL_COMMUNITY): Payer: Self-pay | Admitting: Anesthesiology

## 2018-05-19 ENCOUNTER — Inpatient Hospital Stay (HOSPITAL_COMMUNITY): Payer: BLUE CROSS/BLUE SHIELD

## 2018-05-19 ENCOUNTER — Inpatient Hospital Stay (HOSPITAL_COMMUNITY)
Admission: AD | Disposition: A | Discharge status: Home / Self Care | Payer: Self-pay | Source: Other Acute Inpatient Hospital | Attending: Surgery

## 2018-05-19 ENCOUNTER — Observation Stay (HOSPITAL_COMMUNITY): Payer: BLUE CROSS/BLUE SHIELD

## 2018-05-19 DIAGNOSIS — I10 Essential (primary) hypertension: Secondary | ICD-10-CM | POA: Diagnosis not present

## 2018-05-19 DIAGNOSIS — R0602 Shortness of breath: Secondary | ICD-10-CM | POA: Diagnosis not present

## 2018-05-19 DIAGNOSIS — D62 Acute posthemorrhagic anemia: Secondary | ICD-10-CM | POA: Diagnosis not present

## 2018-05-19 DIAGNOSIS — E119 Type 2 diabetes mellitus without complications: Secondary | ICD-10-CM | POA: Diagnosis present

## 2018-05-19 DIAGNOSIS — I2510 Atherosclerotic heart disease of native coronary artery without angina pectoris: Secondary | ICD-10-CM | POA: Diagnosis not present

## 2018-05-19 DIAGNOSIS — Z833 Family history of diabetes mellitus: Secondary | ICD-10-CM | POA: Diagnosis not present

## 2018-05-19 DIAGNOSIS — R079 Chest pain, unspecified: Secondary | ICD-10-CM | POA: Diagnosis present

## 2018-05-19 DIAGNOSIS — Z951 Presence of aortocoronary bypass graft: Secondary | ICD-10-CM

## 2018-05-19 DIAGNOSIS — I480 Paroxysmal atrial fibrillation: Secondary | ICD-10-CM | POA: Diagnosis not present

## 2018-05-19 DIAGNOSIS — I340 Nonrheumatic mitral (valve) insufficiency: Secondary | ICD-10-CM | POA: Diagnosis not present

## 2018-05-19 DIAGNOSIS — Z4682 Encounter for fitting and adjustment of non-vascular catheter: Secondary | ICD-10-CM | POA: Diagnosis not present

## 2018-05-19 DIAGNOSIS — I214 Non-ST elevation (NSTEMI) myocardial infarction: Secondary | ICD-10-CM | POA: Diagnosis not present

## 2018-05-19 DIAGNOSIS — E8770 Fluid overload, unspecified: Secondary | ICD-10-CM | POA: Diagnosis not present

## 2018-05-19 DIAGNOSIS — K219 Gastro-esophageal reflux disease without esophagitis: Secondary | ICD-10-CM | POA: Diagnosis not present

## 2018-05-19 DIAGNOSIS — Z7984 Long term (current) use of oral hypoglycemic drugs: Secondary | ICD-10-CM | POA: Diagnosis not present

## 2018-05-19 DIAGNOSIS — R0989 Other specified symptoms and signs involving the circulatory and respiratory systems: Secondary | ICD-10-CM | POA: Diagnosis not present

## 2018-05-19 DIAGNOSIS — Z79890 Hormone replacement therapy (postmenopausal): Secondary | ICD-10-CM | POA: Diagnosis not present

## 2018-05-19 DIAGNOSIS — I4891 Unspecified atrial fibrillation: Secondary | ICD-10-CM | POA: Diagnosis not present

## 2018-05-19 DIAGNOSIS — E785 Hyperlipidemia, unspecified: Secondary | ICD-10-CM | POA: Diagnosis present

## 2018-05-19 DIAGNOSIS — I959 Hypotension, unspecified: Secondary | ICD-10-CM | POA: Diagnosis not present

## 2018-05-19 DIAGNOSIS — E039 Hypothyroidism, unspecified: Secondary | ICD-10-CM | POA: Diagnosis present

## 2018-05-19 DIAGNOSIS — I25110 Atherosclerotic heart disease of native coronary artery with unstable angina pectoris: Secondary | ICD-10-CM | POA: Diagnosis not present

## 2018-05-19 DIAGNOSIS — J9811 Atelectasis: Secondary | ICD-10-CM | POA: Diagnosis not present

## 2018-05-19 DIAGNOSIS — Z8249 Family history of ischemic heart disease and other diseases of the circulatory system: Secondary | ICD-10-CM | POA: Diagnosis not present

## 2018-05-19 DIAGNOSIS — Z6841 Body Mass Index (BMI) 40.0 and over, adult: Secondary | ICD-10-CM | POA: Diagnosis not present

## 2018-05-19 HISTORY — PX: CORONARY ARTERY BYPASS GRAFT: SHX141

## 2018-05-19 HISTORY — DX: Presence of aortocoronary bypass graft: Z95.1

## 2018-05-19 HISTORY — PX: TEE WITHOUT CARDIOVERSION: SHX5443

## 2018-05-19 LAB — POCT I-STAT, CHEM 8
BUN: 13 mg/dL (ref 6–20)
BUN: 12 mg/dL (ref 6–20)
BUN: 13 mg/dL (ref 6–20)
BUN: 12 mg/dL (ref 6–20)
BUN: 11 mg/dL (ref 6–20)
BUN: 11 mg/dL (ref 6–20)
CALCIUM ION: 1.21 mmol/L (ref 1.15–1.40)
CALCIUM ION: 1.17 mmol/L (ref 1.15–1.40)
CALCIUM ION: 1.07 mmol/L — AB (ref 1.15–1.40)
CHLORIDE: 102 mmol/L (ref 98–111)
CHLORIDE: 103 mmol/L (ref 98–111)
CREATININE: 0.9 mg/dL (ref 0.61–1.24)
CREATININE: 0.9 mg/dL (ref 0.61–1.24)
Calcium, Ion: 1.01 mmol/L — ABNORMAL LOW (ref 1.15–1.40)
Calcium, Ion: 1.07 mmol/L — ABNORMAL LOW (ref 1.15–1.40)
Calcium, Ion: 1.04 mmol/L — ABNORMAL LOW (ref 1.15–1.40)
Chloride: 102 mmol/L (ref 98–111)
Chloride: 101 mmol/L (ref 98–111)
Chloride: 104 mmol/L (ref 98–111)
Chloride: 102 mmol/L (ref 98–111)
Creatinine, Ser: 0.9 mg/dL (ref 0.61–1.24)
Creatinine, Ser: 0.9 mg/dL (ref 0.61–1.24)
Creatinine, Ser: 0.9 mg/dL (ref 0.61–1.24)
Creatinine, Ser: 0.9 mg/dL (ref 0.61–1.24)
GLUCOSE: 153 mg/dL — AB (ref 70–99)
GLUCOSE: 165 mg/dL — AB (ref 70–99)
Glucose, Bld: 128 mg/dL — ABNORMAL HIGH (ref 70–99)
Glucose, Bld: 138 mg/dL — ABNORMAL HIGH (ref 70–99)
Glucose, Bld: 162 mg/dL — ABNORMAL HIGH (ref 70–99)
Glucose, Bld: 128 mg/dL — ABNORMAL HIGH (ref 70–99)
HCT: 37 % — ABNORMAL LOW (ref 39.0–52.0)
HCT: 35 % — ABNORMAL LOW (ref 39.0–52.0)
HCT: 32 % — ABNORMAL LOW (ref 39.0–52.0)
HEMATOCRIT: 30 % — AB (ref 39.0–52.0)
HEMATOCRIT: 32 % — AB (ref 39.0–52.0)
HEMATOCRIT: 29 % — AB (ref 39.0–52.0)
HEMOGLOBIN: 12.6 g/dL — AB (ref 13.0–17.0)
HEMOGLOBIN: 10.2 g/dL — AB (ref 13.0–17.0)
HEMOGLOBIN: 10.9 g/dL — AB (ref 13.0–17.0)
Hemoglobin: 11.9 g/dL — ABNORMAL LOW (ref 13.0–17.0)
Hemoglobin: 9.9 g/dL — ABNORMAL LOW (ref 13.0–17.0)
Hemoglobin: 10.9 g/dL — ABNORMAL LOW (ref 13.0–17.0)
POTASSIUM: 3.9 mmol/L (ref 3.5–5.1)
POTASSIUM: 3.7 mmol/L (ref 3.5–5.1)
POTASSIUM: 4.2 mmol/L (ref 3.5–5.1)
Potassium: 3.9 mmol/L (ref 3.5–5.1)
Potassium: 4.2 mmol/L (ref 3.5–5.1)
Potassium: 4.4 mmol/L (ref 3.5–5.1)
SODIUM: 139 mmol/L (ref 135–145)
SODIUM: 139 mmol/L (ref 135–145)
SODIUM: 141 mmol/L (ref 135–145)
Sodium: 140 mmol/L (ref 135–145)
Sodium: 137 mmol/L (ref 135–145)
Sodium: 139 mmol/L (ref 135–145)
TCO2: 27 mmol/L (ref 22–32)
TCO2: 25 mmol/L (ref 22–32)
TCO2: 26 mmol/L (ref 22–32)
TCO2: 25 mmol/L (ref 22–32)
TCO2: 22 mmol/L (ref 22–32)
TCO2: 24 mmol/L (ref 22–32)

## 2018-05-19 LAB — CBC
HCT: 42.1 % (ref 39.0–52.0)
HEMATOCRIT: 36.9 % — AB (ref 39.0–52.0)
HEMATOCRIT: 34.5 % — AB (ref 39.0–52.0)
HEMOGLOBIN: 11.4 g/dL — AB (ref 13.0–17.0)
Hemoglobin: 13.8 g/dL (ref 13.0–17.0)
Hemoglobin: 12.2 g/dL — ABNORMAL LOW (ref 13.0–17.0)
MCH: 29.6 pg (ref 26.0–34.0)
MCH: 29.7 pg (ref 26.0–34.0)
MCH: 29.4 pg (ref 26.0–34.0)
MCHC: 32.8 g/dL (ref 30.0–36.0)
MCHC: 33.1 g/dL (ref 30.0–36.0)
MCHC: 33 g/dL (ref 30.0–36.0)
MCV: 90.1 fL (ref 78.0–100.0)
MCV: 89.8 fL (ref 78.0–100.0)
MCV: 88.9 fL (ref 78.0–100.0)
PLATELETS: 189 10*3/uL (ref 150–400)
PLATELETS: 154 10*3/uL (ref 150–400)
Platelets: 149 10*3/uL — ABNORMAL LOW (ref 150–400)
RBC: 4.67 MIL/uL (ref 4.22–5.81)
RBC: 4.11 MIL/uL — AB (ref 4.22–5.81)
RBC: 3.88 MIL/uL — ABNORMAL LOW (ref 4.22–5.81)
RDW: 13.1 % (ref 11.5–15.5)
RDW: 12.9 % (ref 11.5–15.5)
RDW: 12.9 % (ref 11.5–15.5)
WBC: 7.1 10*3/uL (ref 4.0–10.5)
WBC: 11.8 10*3/uL — ABNORMAL HIGH (ref 4.0–10.5)
WBC: 10.2 10*3/uL (ref 4.0–10.5)

## 2018-05-19 LAB — BASIC METABOLIC PANEL
ANION GAP: 11 (ref 5–15)
BUN: 14 mg/dL (ref 6–20)
CALCIUM: 8.9 mg/dL (ref 8.9–10.3)
CO2: 27 mmol/L (ref 22–32)
Chloride: 101 mmol/L (ref 98–111)
Creatinine, Ser: 1.17 mg/dL (ref 0.61–1.24)
GFR calc non Af Amer: >60 mL/min (ref >60)
GLUCOSE: 143 mg/dL — AB (ref 70–99)
POTASSIUM: 3.9 mmol/L (ref 3.5–5.1)
SODIUM: 139 mmol/L (ref 135–145)

## 2018-05-19 LAB — URINALYSIS, ROUTINE W REFLEX MICROSCOPIC
BILIRUBIN URINE: NEGATIVE
Glucose, UA: 50 mg/dL — AB
HGB URINE DIPSTICK: NEGATIVE
KETONES UR: NEGATIVE mg/dL
Leukocytes, UA: NEGATIVE
NITRITE: NEGATIVE
Protein, ur: NEGATIVE mg/dL
Specific Gravity, Urine: 1.044 — ABNORMAL HIGH (ref 1.005–1.030)
pH: 5 (ref 5.0–8.0)

## 2018-05-19 LAB — POCT I-STAT 3, ART BLOOD GAS (G3+)
ACID-BASE DEFICIT: 3 mmol/L — AB (ref 0.0–2.0)
Acid-Base Excess: 1 mmol/L (ref 0.0–2.0)
Acid-base deficit: 3 mmol/L — ABNORMAL HIGH (ref 0.0–2.0)
Acid-base deficit: 1 mmol/L (ref 0.0–2.0)
BICARBONATE: 26.5 mmol/L (ref 20.0–28.0)
BICARBONATE: 25.9 mmol/L (ref 20.0–28.0)
Bicarbonate: 23.8 mmol/L (ref 20.0–28.0)
Bicarbonate: 24 mmol/L (ref 20.0–28.0)
Bicarbonate: 25 mmol/L (ref 20.0–28.0)
O2 Saturation: 100 %
O2 Saturation: 98 %
O2 Saturation: 98 %
O2 Saturation: 96 %
O2 Saturation: 98 %
PCO2 ART: 46 mmHg (ref 32.0–48.0)
PCO2 ART: 45.9 mmHg (ref 32.0–48.0)
PH ART: 7.369 (ref 7.350–7.450)
PH ART: 7.342 — AB (ref 7.350–7.450)
PH ART: 7.343 — AB (ref 7.350–7.450)
Patient temperature: 36.6
Patient temperature: 36.9
TCO2: 28 mmol/L (ref 22–32)
TCO2: 25 mmol/L (ref 22–32)
TCO2: 25 mmol/L (ref 22–32)
TCO2: 26 mmol/L (ref 22–32)
TCO2: 27 mmol/L (ref 22–32)
pCO2 arterial: 49.4 mmHg — ABNORMAL HIGH (ref 32.0–48.0)
pCO2 arterial: 47.7 mmHg (ref 32.0–48.0)
pCO2 arterial: 47.7 mmHg (ref 32.0–48.0)
pH, Arterial: 7.288 — ABNORMAL LOW (ref 7.350–7.450)
pH, Arterial: 7.306 — ABNORMAL LOW (ref 7.350–7.450)
pO2, Arterial: 338 mmHg — ABNORMAL HIGH (ref 83.0–108.0)
pO2, Arterial: 109 mmHg — ABNORMAL HIGH (ref 83.0–108.0)
pO2, Arterial: 107 mmHg (ref 83.0–108.0)
pO2, Arterial: 89 mmHg (ref 83.0–108.0)
pO2, Arterial: 103 mmHg (ref 83.0–108.0)

## 2018-05-19 LAB — PROTIME-INR
INR: 1.28
Prothrombin Time: 15.9 seconds — ABNORMAL HIGH (ref 11.4–15.2)

## 2018-05-19 LAB — BLOOD GAS, ARTERIAL
Acid-Base Excess: 1.1 mmol/L (ref 0.0–2.0)
BICARBONATE: 25.7 mmol/L (ref 20.0–28.0)
Drawn by: 362771
O2 CONTENT: 2 L/min
O2 SAT: 97.7 %
PCO2 ART: 44.6 mmHg (ref 32.0–48.0)
PO2 ART: 111 mmHg — AB (ref 83.0–108.0)
Patient temperature: 98.6
pH, Arterial: 7.378 (ref 7.350–7.450)

## 2018-05-19 LAB — GLUCOSE, CAPILLARY
GLUCOSE-CAPILLARY: 140 mg/dL — AB (ref 70–99)
GLUCOSE-CAPILLARY: 117 mg/dL — AB (ref 70–99)
GLUCOSE-CAPILLARY: 158 mg/dL — AB (ref 70–99)
GLUCOSE-CAPILLARY: 139 mg/dL — AB (ref 70–99)
GLUCOSE-CAPILLARY: 120 mg/dL — AB (ref 70–99)
GLUCOSE-CAPILLARY: 137 mg/dL — AB (ref 70–99)
GLUCOSE-CAPILLARY: 162 mg/dL — AB (ref 70–99)
Glucose-Capillary: 141 mg/dL — ABNORMAL HIGH (ref 70–99)

## 2018-05-19 LAB — APTT: APTT: 28 s (ref 24–36)

## 2018-05-19 LAB — MAGNESIUM: Magnesium: 2.9 mg/dL — ABNORMAL HIGH (ref 1.7–2.4)

## 2018-05-19 LAB — HEPARIN LEVEL (UNFRACTIONATED): Heparin Unfractionated: 0.34 IU/mL (ref 0.30–0.70)

## 2018-05-19 LAB — TSH: TSH: 9.385 u[IU]/mL — ABNORMAL HIGH (ref 0.350–4.500)

## 2018-05-19 LAB — PLATELET COUNT: PLATELETS: 160 10*3/uL (ref 150–400)

## 2018-05-19 LAB — SURGICAL PCR SCREEN
MRSA, PCR: NEGATIVE
Staphylococcus aureus: POSITIVE — AB

## 2018-05-19 LAB — HEMOGLOBIN AND HEMATOCRIT, BLOOD
HCT: 33.6 % — ABNORMAL LOW (ref 39.0–52.0)
Hemoglobin: 11.1 g/dL — ABNORMAL LOW (ref 13.0–17.0)

## 2018-05-19 SURGERY — CORONARY ARTERY BYPASS GRAFTING (CABG)
Anesthesia: General | Site: Chest

## 2018-05-19 MED ORDER — MIDAZOLAM HCL 10 MG/2ML IJ SOLN
INTRAMUSCULAR | Status: AC
  Filled 2018-05-19: qty 2

## 2018-05-19 MED ORDER — CHLORHEXIDINE GLUCONATE 0.12 % MT SOLN
15.0000 mL | OROMUCOSAL | Status: AC
  Administered 2018-05-19: 15 mL via OROMUCOSAL

## 2018-05-19 MED ORDER — CHLORHEXIDINE GLUCONATE CLOTH 2 % EX PADS: 6.0000 | MEDICATED_PAD | Freq: Every day | CUTANEOUS | Status: DC

## 2018-05-19 MED ORDER — SODIUM CHLORIDE 0.9% FLUSH
3.0000 mL | Freq: Two times a day (BID) | INTRAVENOUS | Status: DC
  Administered 2018-05-20: 3 mL via INTRAVENOUS

## 2018-05-19 MED ORDER — HEPARIN SODIUM (PORCINE) 1000 UNIT/ML IJ SOLN
INTRAMUSCULAR | Status: AC
  Filled 2018-05-19: qty 1

## 2018-05-19 MED ORDER — VECURONIUM BROMIDE 10 MG IV SOLR
INTRAVENOUS | Status: AC
  Filled 2018-05-19: qty 10

## 2018-05-19 MED ORDER — MIDAZOLAM HCL 2 MG/2ML IJ SOLN
INTRAMUSCULAR | Status: AC
  Filled 2018-05-19: qty 2

## 2018-05-19 MED ORDER — BISACODYL 10 MG RE SUPP: 10.0000 mg | Freq: Every day | RECTAL | Status: DC

## 2018-05-19 MED ORDER — THROMBIN 20000 UNITS EX SOLR: CUTANEOUS | Status: DC | PRN

## 2018-05-19 MED ORDER — SODIUM CHLORIDE 0.9 % IV SOLN
INTRAVENOUS | Status: DC | PRN
  Administered 2018-05-19: 14:00:00 via INTRAVENOUS

## 2018-05-19 MED ORDER — PHENYLEPHRINE 40 MCG/ML (10ML) SYRINGE FOR IV PUSH (FOR BLOOD PRESSURE SUPPORT)
PREFILLED_SYRINGE | INTRAVENOUS | Status: DC | PRN
  Administered 2018-05-19: 80 ug via INTRAVENOUS

## 2018-05-19 MED ORDER — 0.9 % SODIUM CHLORIDE (POUR BTL) OPTIME
TOPICAL | Status: DC | PRN
  Administered 2018-05-19: 5000 mL

## 2018-05-19 MED ORDER — FAMOTIDINE IN NACL 20-0.9 MG/50ML-% IV SOLN
20.0000 mg | Freq: Two times a day (BID) | INTRAVENOUS | Status: AC
  Administered 2018-05-19 – 2018-05-20 (×2): 20 mg via INTRAVENOUS
  Filled 2018-05-19: qty 50

## 2018-05-19 MED ORDER — DEXMEDETOMIDINE HCL IN NACL 200 MCG/50ML IV SOLN
0.0000 ug/kg/h | INTRAVENOUS | Status: DC
  Administered 2018-05-19: 0.7 ug/kg/h via INTRAVENOUS
  Filled 2018-05-19 (×2): qty 50

## 2018-05-19 MED ORDER — ONDANSETRON HCL 4 MG/2ML IJ SOLN: 4.0000 mg | Freq: Four times a day (QID) | INTRAMUSCULAR | Status: DC | PRN

## 2018-05-19 MED ORDER — POTASSIUM CHLORIDE 10 MEQ/50ML IV SOLN
10.0000 meq | INTRAVENOUS | Status: AC
  Administered 2018-05-19 (×3): 10 meq via INTRAVENOUS

## 2018-05-19 MED ORDER — SODIUM BICARBONATE 8.4 % IV SOLN
50.0000 meq | Freq: Once | INTRAVENOUS | Status: AC
  Administered 2018-05-19: 50 meq via INTRAVENOUS

## 2018-05-19 MED ORDER — ROCURONIUM BROMIDE 10 MG/ML (PF) SYRINGE
PREFILLED_SYRINGE | INTRAVENOUS | Status: DC | PRN
  Administered 2018-05-19 (×2): 50 mg via INTRAVENOUS

## 2018-05-19 MED ORDER — SODIUM CHLORIDE 0.9 % IJ SOLN
INTRAMUSCULAR | Status: AC
  Filled 2018-05-19: qty 10

## 2018-05-19 MED ORDER — PROTAMINE SULFATE 10 MG/ML IV SOLN
INTRAVENOUS | Status: AC
  Filled 2018-05-19: qty 25

## 2018-05-19 MED ORDER — ACETAMINOPHEN 500 MG PO TABS
1000.0000 mg | ORAL_TABLET | Freq: Four times a day (QID) | ORAL | Status: DC
  Administered 2018-05-20 – 2018-05-21 (×5): 1000 mg via ORAL
  Filled 2018-05-19 (×5): qty 2

## 2018-05-19 MED ORDER — EPHEDRINE SULFATE 50 MG/ML IJ SOLN
INTRAMUSCULAR | Status: AC
  Filled 2018-05-19: qty 1

## 2018-05-19 MED ORDER — MORPHINE SULFATE (PF) 2 MG/ML IV SOLN
2.0000 mg | INTRAVENOUS | Status: DC | PRN
  Administered 2018-05-20: 2 mg via INTRAVENOUS
  Administered 2018-05-20: 4 mg via INTRAVENOUS
  Administered 2018-05-20: 2 mg via INTRAVENOUS
  Filled 2018-05-19: qty 2
  Filled 2018-05-19 (×3): qty 1

## 2018-05-19 MED ORDER — OXYCODONE HCL 5 MG PO TABS
5.0000 mg | ORAL_TABLET | ORAL | Status: DC | PRN
  Administered 2018-05-20 – 2018-05-21 (×6): 10 mg via ORAL
  Filled 2018-05-19 (×6): qty 2

## 2018-05-19 MED ORDER — PROPOFOL 10 MG/ML IV BOLUS
INTRAVENOUS | Status: AC
  Filled 2018-05-19: qty 20

## 2018-05-19 MED ORDER — EPHEDRINE SULFATE 50 MG/ML IJ SOLN
INTRAMUSCULAR | Status: DC | PRN
  Administered 2018-05-19: 10 mg via INTRAVENOUS

## 2018-05-19 MED ORDER — TRAMADOL HCL 50 MG PO TABS: 50.0000 mg | ORAL_TABLET | ORAL | Status: DC | PRN

## 2018-05-19 MED ORDER — MAGNESIUM SULFATE 4 GM/100ML IV SOLN
4.0000 g | Freq: Once | INTRAVENOUS | Status: AC
  Administered 2018-05-19: 4 g via INTRAVENOUS
  Filled 2018-05-19: qty 100

## 2018-05-19 MED ORDER — THROMBIN 20000 UNITS EX SOLR
CUTANEOUS | Status: DC | PRN
  Administered 2018-05-19: 20000 [IU] via TOPICAL

## 2018-05-19 MED ORDER — ALBUMIN HUMAN 5 % IV SOLN
INTRAVENOUS | Status: DC | PRN
  Administered 2018-05-19: 13:00:00 via INTRAVENOUS

## 2018-05-19 MED ORDER — SODIUM CHLORIDE 0.9 % IV SOLN: 250.0000 mL | INTRAVENOUS | Status: DC

## 2018-05-19 MED ORDER — CEFUROXIME SODIUM 750 MG IJ SOLR
INTRAMUSCULAR | Status: DC | PRN
  Administered 2018-05-19: 750 mg via INTRAVENOUS

## 2018-05-19 MED ORDER — VECURONIUM BROMIDE 10 MG IV SOLR
INTRAVENOUS | Status: DC | PRN
  Administered 2018-05-19: 4 mg via INTRAVENOUS
  Administered 2018-05-19: 3 mg via INTRAVENOUS
  Administered 2018-05-19: 6 mg via INTRAVENOUS
  Administered 2018-05-19: 5 mg via INTRAVENOUS
  Administered 2018-05-19: 2 mg via INTRAVENOUS

## 2018-05-19 MED ORDER — ALBUMIN HUMAN 5 % IV SOLN
250.0000 mL | INTRAVENOUS | Status: AC | PRN
  Administered 2018-05-19 (×3): 250 mL via INTRAVENOUS
  Filled 2018-05-19: qty 250

## 2018-05-19 MED ORDER — MORPHINE SULFATE (PF) 2 MG/ML IV SOLN
1.0000 mg | INTRAVENOUS | Status: AC | PRN
  Administered 2018-05-19 (×2): 2 mg via INTRAVENOUS
  Administered 2018-05-19: 4 mg via INTRAVENOUS
  Administered 2018-05-20: 2 mg via INTRAVENOUS
  Filled 2018-05-19: qty 1
  Filled 2018-05-19: qty 2
  Filled 2018-05-19: qty 1

## 2018-05-19 MED ORDER — ASPIRIN EC 325 MG PO TBEC
325.0000 mg | DELAYED_RELEASE_TABLET | Freq: Every day | ORAL | Status: DC
  Administered 2018-05-20: 325 mg via ORAL
  Filled 2018-05-19 (×2): qty 1

## 2018-05-19 MED ORDER — THROMBIN 20000 UNITS EX SOLR
CUTANEOUS | Status: AC
  Filled 2018-05-19: qty 20000

## 2018-05-19 MED ORDER — METOPROLOL TARTRATE 12.5 MG HALF TABLET
12.5000 mg | ORAL_TABLET | Freq: Two times a day (BID) | ORAL | Status: DC
  Filled 2018-05-19: qty 1

## 2018-05-19 MED ORDER — DOCUSATE SODIUM 100 MG PO CAPS
200.0000 mg | ORAL_CAPSULE | Freq: Every day | ORAL | Status: DC
Start: 2018-05-20 — End: 2018-05-21
  Administered 2018-05-20: 200 mg via ORAL
  Filled 2018-05-19 (×2): qty 2

## 2018-05-19 MED ORDER — FENTANYL CITRATE (PF) 250 MCG/5ML IJ SOLN
INTRAMUSCULAR | Status: DC | PRN
  Administered 2018-05-19: 100 ug via INTRAVENOUS
  Administered 2018-05-19: 50 ug via INTRAVENOUS
  Administered 2018-05-19: 150 ug via INTRAVENOUS
  Administered 2018-05-19 (×2): 50 ug via INTRAVENOUS
  Administered 2018-05-19: 950 ug via INTRAVENOUS

## 2018-05-19 MED ORDER — METOPROLOL TARTRATE 25 MG/10 ML ORAL SUSPENSION
12.5000 mg | Freq: Two times a day (BID) | ORAL | Status: DC
  Administered 2018-05-20: 12.5 mg
  Filled 2018-05-19: qty 5

## 2018-05-19 MED ORDER — LACTATED RINGERS IV SOLN: INTRAVENOUS | Status: DC

## 2018-05-19 MED ORDER — LACTATED RINGERS IV SOLN
INTRAVENOUS | Status: DC
  Administered 2018-05-20: 02:00:00 via INTRAVENOUS

## 2018-05-19 MED ORDER — SODIUM CHLORIDE 0.45 % IV SOLN
INTRAVENOUS | Status: DC | PRN
  Administered 2018-05-19: 15:00:00 via INTRAVENOUS

## 2018-05-19 MED ORDER — SODIUM CHLORIDE 0.9 % IV SOLN
1.5000 g | Freq: Two times a day (BID) | INTRAVENOUS | Status: AC
  Administered 2018-05-19 – 2018-05-21 (×4): 1.5 g via INTRAVENOUS
  Filled 2018-05-19 (×4): qty 1.5

## 2018-05-19 MED ORDER — LACTATED RINGERS IV SOLN
INTRAVENOUS | Status: DC | PRN
  Administered 2018-05-19: 08:00:00 via INTRAVENOUS

## 2018-05-19 MED ORDER — INSULIN REGULAR BOLUS VIA INFUSION
0.0000 [IU] | Freq: Three times a day (TID) | INTRAVENOUS | Status: DC
  Filled 2018-05-19: qty 10

## 2018-05-19 MED ORDER — PANTOPRAZOLE SODIUM 40 MG PO TBEC
40.0000 mg | DELAYED_RELEASE_TABLET | Freq: Every day | ORAL | Status: DC
  Filled 2018-05-19: qty 1

## 2018-05-19 MED ORDER — PROPOFOL 10 MG/ML IV BOLUS
INTRAVENOUS | Status: DC | PRN
  Administered 2018-05-19: 20 mg via INTRAVENOUS
  Administered 2018-05-19: 50 mg via INTRAVENOUS

## 2018-05-19 MED ORDER — HEMOSTATIC AGENTS (NO CHARGE) OPTIME
TOPICAL | Status: DC | PRN
  Administered 2018-05-19 (×2): 1 via TOPICAL

## 2018-05-19 MED ORDER — HEMOSTATIC AGENTS (NO CHARGE) OPTIME
TOPICAL | Status: DC | PRN
  Administered 2018-05-19: 1 via TOPICAL

## 2018-05-19 MED ORDER — ACETAMINOPHEN 160 MG/5ML PO SOLN: 1000.0000 mg | Freq: Four times a day (QID) | ORAL | Status: DC

## 2018-05-19 MED ORDER — FENTANYL CITRATE (PF) 250 MCG/5ML IJ SOLN
INTRAMUSCULAR | Status: AC
  Filled 2018-05-19: qty 25

## 2018-05-19 MED ORDER — MIDAZOLAM HCL 5 MG/5ML IJ SOLN
INTRAMUSCULAR | Status: DC | PRN
  Administered 2018-05-19 (×3): 2 mg via INTRAVENOUS
  Administered 2018-05-19: 1 mg via INTRAVENOUS
  Administered 2018-05-19: 3 mg via INTRAVENOUS

## 2018-05-19 MED ORDER — DEXTROSE 5 % IV SOLN
INTRAVENOUS | Status: DC | PRN
  Administered 2018-05-19: 25 ug/min via INTRAVENOUS

## 2018-05-19 MED ORDER — PROTAMINE SULFATE 10 MG/ML IV SOLN
INTRAVENOUS | Status: AC
  Filled 2018-05-19: qty 10

## 2018-05-19 MED ORDER — HEPARIN SODIUM (PORCINE) 1000 UNIT/ML IJ SOLN
INTRAMUSCULAR | Status: DC | PRN
  Administered 2018-05-19: 37000 [IU] via INTRAVENOUS

## 2018-05-19 MED ORDER — LACTATED RINGERS IV SOLN: 500.0000 mL | Freq: Once | INTRAVENOUS | Status: DC | PRN

## 2018-05-19 MED ORDER — BISACODYL 5 MG PO TBEC
10.0000 mg | DELAYED_RELEASE_TABLET | Freq: Every day | ORAL | Status: DC
  Administered 2018-05-20: 10 mg via ORAL
  Filled 2018-05-19 (×2): qty 2

## 2018-05-19 MED ORDER — PROTAMINE SULFATE 10 MG/ML IV SOLN
INTRAVENOUS | Status: DC | PRN
  Administered 2018-05-19: 350 mg via INTRAVENOUS

## 2018-05-19 MED ORDER — ACETAMINOPHEN 160 MG/5ML PO SOLN: 650.0000 mg | Freq: Once | ORAL | Status: AC

## 2018-05-19 MED ORDER — SODIUM CHLORIDE 0.9 % IV SOLN
INTRAVENOUS | Status: DC
  Administered 2018-05-19 – 2018-05-20 (×2): via INTRAVENOUS

## 2018-05-19 MED ORDER — ACETAMINOPHEN 650 MG RE SUPP
650.0000 mg | Freq: Once | RECTAL | Status: AC
  Administered 2018-05-19: 650 mg via RECTAL

## 2018-05-19 MED ORDER — SODIUM CHLORIDE 0.9 % IV SOLN
INTRAVENOUS | Status: DC
  Filled 2018-05-19: qty 1

## 2018-05-19 MED ORDER — ASPIRIN 81 MG PO CHEW: 324.0000 mg | CHEWABLE_TABLET | Freq: Every day | ORAL | Status: DC

## 2018-05-19 MED ORDER — METOPROLOL TARTRATE 5 MG/5ML IV SOLN: 2.5000 mg | INTRAVENOUS | Status: DC | PRN

## 2018-05-19 MED ORDER — FENTANYL CITRATE (PF) 250 MCG/5ML IJ SOLN
INTRAMUSCULAR | Status: AC
  Filled 2018-05-19: qty 5

## 2018-05-19 MED ORDER — NITROGLYCERIN IN D5W 200-5 MCG/ML-% IV SOLN: 0.0000 ug/min | INTRAVENOUS | Status: DC

## 2018-05-19 MED ORDER — PHENYLEPHRINE HCL-NACL 20-0.9 MG/250ML-% IV SOLN
0.0000 ug/min | INTRAVENOUS | Status: DC
  Filled 2018-05-19 (×2): qty 250

## 2018-05-19 MED ORDER — MIDAZOLAM HCL 2 MG/2ML IJ SOLN: 2.0000 mg | INTRAMUSCULAR | Status: DC | PRN

## 2018-05-19 MED ORDER — ROCURONIUM BROMIDE 10 MG/ML (PF) SYRINGE
PREFILLED_SYRINGE | INTRAVENOUS | Status: AC
  Filled 2018-05-19: qty 10

## 2018-05-19 MED ORDER — SODIUM CHLORIDE 0.9% FLUSH: 3.0000 mL | INTRAVENOUS | Status: DC | PRN

## 2018-05-19 MED ORDER — MUPIROCIN 2 % EX OINT
1.0000 "application " | TOPICAL_OINTMENT | Freq: Two times a day (BID) | CUTANEOUS | Status: DC
  Administered 2018-05-19 – 2018-05-22 (×7): 1 via NASAL
  Filled 2018-05-19 (×4): qty 22

## 2018-05-19 MED ORDER — VANCOMYCIN HCL IN DEXTROSE 1-5 GM/200ML-% IV SOLN
1000.0000 mg | Freq: Once | INTRAVENOUS | Status: AC
  Administered 2018-05-19: 1000 mg via INTRAVENOUS
  Filled 2018-05-19: qty 200

## 2018-05-19 MED ORDER — METOCLOPRAMIDE HCL 5 MG/ML IJ SOLN
10.0000 mg | Freq: Four times a day (QID) | INTRAMUSCULAR | Status: AC
  Administered 2018-05-19 – 2018-05-20 (×4): 10 mg via INTRAVENOUS
  Filled 2018-05-19 (×3): qty 2

## 2018-05-19 SURGICAL SUPPLY — 106 items
ADAPTER CARDIO PERF ANTE/RETRO (ADAPTER) ×3 IMPLANT
BAG DECANTER FOR FLEXI CONT (MISCELLANEOUS) ×3 IMPLANT
BANDAGE ACE 4X5 VEL STRL LF (GAUZE/BANDAGES/DRESSINGS) ×3 IMPLANT
BANDAGE ACE 6X5 VEL STRL LF (GAUZE/BANDAGES/DRESSINGS) ×3 IMPLANT
BASKET HEART (ORDER IN 25'S) (MISCELLANEOUS) ×1
BASKET HEART (ORDER IN 25S) (MISCELLANEOUS) ×2 IMPLANT
BLADE STERNUM SYSTEM 6 (BLADE) ×3 IMPLANT
BLADE SURG 11 STRL SS (BLADE) ×3 IMPLANT
BNDG GAUZE ELAST 4 BULKY (GAUZE/BANDAGES/DRESSINGS) ×3 IMPLANT
CANISTER SUCT 3000ML PPV (MISCELLANEOUS) ×3 IMPLANT
CANNULA ARTERIAL NVNT 3/8 22FR (MISCELLANEOUS) ×3 IMPLANT
CANNULA GUNDRY RCSP 15FR (MISCELLANEOUS) ×3 IMPLANT
CATH ROBINSON RED A/P 18FR (CATHETERS) ×9 IMPLANT
CATH THORACIC 28FR (CATHETERS) ×3 IMPLANT
CATH THORACIC 36FR (CATHETERS) ×3 IMPLANT
CATH THORACIC 36FR RT ANG (CATHETERS) ×3 IMPLANT
CLIP FOGARTY SPRING 6M (CLIP) ×3 IMPLANT
CLIP VESOCCLUDE MED 24/CT (CLIP) IMPLANT
CLIP VESOCCLUDE SM WIDE 24/CT (CLIP) ×6 IMPLANT
CRADLE DONUT ADULT HEAD (MISCELLANEOUS) ×3 IMPLANT
DERMABOND ADVANCED (GAUZE/BANDAGES/DRESSINGS) ×2
DERMABOND ADVANCED .7 DNX12 (GAUZE/BANDAGES/DRESSINGS) ×4 IMPLANT
DRAPE CARDIOVASCULAR INCISE (DRAPES) ×1
DRAPE SLUSH/WARMER DISC (DRAPES) ×3 IMPLANT
DRAPE SRG 135X102X78XABS (DRAPES) ×2 IMPLANT
DRSG AQUACEL AG ADV 3.5X14 (GAUZE/BANDAGES/DRESSINGS) ×3 IMPLANT
DRSG COVADERM 4X14 (GAUZE/BANDAGES/DRESSINGS) ×3 IMPLANT
ELECT CAUTERY BLADE 6.4 (BLADE) ×3 IMPLANT
ELECT REM PT RETURN 9FT ADLT (ELECTROSURGICAL) ×6
ELECTRODE REM PT RTRN 9FT ADLT (ELECTROSURGICAL) ×4 IMPLANT
FELT TEFLON 1X6 (MISCELLANEOUS) ×3 IMPLANT
GAUZE SPONGE 4X4 12PLY STRL (GAUZE/BANDAGES/DRESSINGS) ×6 IMPLANT
GLOVE BIO SURGEON STRL SZ 6 (GLOVE) IMPLANT
GLOVE BIO SURGEON STRL SZ 6.5 (GLOVE) ×18 IMPLANT
GLOVE BIO SURGEON STRL SZ7 (GLOVE) IMPLANT
GLOVE BIO SURGEON STRL SZ7.5 (GLOVE) IMPLANT
GLOVE BIOGEL PI IND STRL 6 (GLOVE) IMPLANT
GLOVE BIOGEL PI IND STRL 6.5 (GLOVE) ×12 IMPLANT
GLOVE BIOGEL PI IND STRL 7.0 (GLOVE) IMPLANT
GLOVE BIOGEL PI INDICATOR 6 (GLOVE)
GLOVE BIOGEL PI INDICATOR 6.5 (GLOVE) ×6
GLOVE BIOGEL PI INDICATOR 7.0 (GLOVE)
GLOVE EUDERMIC 7 POWDERFREE (GLOVE) ×9 IMPLANT
GLOVE ORTHO TXT STRL SZ7.5 (GLOVE) IMPLANT
GOWN STRL REUS W/ TWL LRG LVL3 (GOWN DISPOSABLE) ×12 IMPLANT
GOWN STRL REUS W/ TWL XL LVL3 (GOWN DISPOSABLE) ×6 IMPLANT
GOWN STRL REUS W/TWL LRG LVL3 (GOWN DISPOSABLE) ×6
GOWN STRL REUS W/TWL XL LVL3 (GOWN DISPOSABLE) ×3
HEMOSTAT ARISTA ABSORB 3G PWDR (MISCELLANEOUS) ×6 IMPLANT
HEMOSTAT SURGICEL 2X14 (HEMOSTASIS) ×3 IMPLANT
INSERT FOGARTY 61MM (MISCELLANEOUS) IMPLANT
INSERT FOGARTY XLG (MISCELLANEOUS) ×3 IMPLANT
KIT BASIN OR (CUSTOM PROCEDURE TRAY) ×3 IMPLANT
KIT CATH CPB BARTLE (MISCELLANEOUS) ×3 IMPLANT
KIT SUCTION CATH 14FR (SUCTIONS) ×3 IMPLANT
KIT TURNOVER KIT B (KITS) ×3 IMPLANT
KIT VASOVIEW HEMOPRO VH 3000 (KITS) ×3 IMPLANT
NS IRRIG 1000ML POUR BTL (IV SOLUTION) ×15 IMPLANT
PACK E OPEN HEART (SUTURE) ×3 IMPLANT
PACK OPEN HEART (CUSTOM PROCEDURE TRAY) ×3 IMPLANT
PAD ARMBOARD 7.5X6 YLW CONV (MISCELLANEOUS) ×6 IMPLANT
PAD ELECT DEFIB RADIOL ZOLL (MISCELLANEOUS) ×3 IMPLANT
PENCIL BUTTON HOLSTER BLD 10FT (ELECTRODE) ×6 IMPLANT
PUNCH AORTIC ROTATE 4.0MM (MISCELLANEOUS) IMPLANT
PUNCH AORTIC ROTATE 4.5MM 8IN (MISCELLANEOUS) ×3 IMPLANT
PUNCH AORTIC ROTATE 5MM 8IN (MISCELLANEOUS) IMPLANT
SET CARDIOPLEGIA MPS 5001102 (MISCELLANEOUS) ×3 IMPLANT
SOLUTION ANTI FOG 6CC (MISCELLANEOUS) ×3 IMPLANT
SPONGE INTESTINAL PEANUT (DISPOSABLE) IMPLANT
SPONGE LAP 18X18 X RAY DECT (DISPOSABLE) ×6 IMPLANT
SPONGE LAP 4X18 RFD (DISPOSABLE) ×3 IMPLANT
SUT BONE WAX W31G (SUTURE) ×3 IMPLANT
SUT MNCRL AB 4-0 PS2 18 (SUTURE) ×3 IMPLANT
SUT PROLENE 3 0 SH DA (SUTURE) IMPLANT
SUT PROLENE 3 0 SH1 36 (SUTURE) ×3 IMPLANT
SUT PROLENE 4 0 RB 1 (SUTURE) ×2
SUT PROLENE 4 0 SH DA (SUTURE) IMPLANT
SUT PROLENE 4-0 RB1 .5 CRCL 36 (SUTURE) ×4 IMPLANT
SUT PROLENE 5 0 C 1 36 (SUTURE) IMPLANT
SUT PROLENE 6 0 C 1 30 (SUTURE) ×6 IMPLANT
SUT PROLENE 7 0 BV 1 (SUTURE) ×3 IMPLANT
SUT PROLENE 7 0 BV1 MDA (SUTURE) ×3 IMPLANT
SUT PROLENE 8 0 BV175 6 (SUTURE) ×3 IMPLANT
SUT SILK  1 MH (SUTURE)
SUT SILK 1 MH (SUTURE) IMPLANT
SUT SILK 2 0 SH CR/8 (SUTURE) ×3 IMPLANT
SUT STEEL STERNAL CCS#1 18IN (SUTURE) IMPLANT
SUT STEEL SZ 6 DBL 3X14 BALL (SUTURE) ×9 IMPLANT
SUT VIC AB 1 CTX 36 (SUTURE) ×2
SUT VIC AB 1 CTX36XBRD ANBCTR (SUTURE) ×4 IMPLANT
SUT VIC AB 2-0 CT1 27 (SUTURE) ×1
SUT VIC AB 2-0 CT1 TAPERPNT 27 (SUTURE) ×2 IMPLANT
SUT VIC AB 2-0 CTX 27 (SUTURE) IMPLANT
SUT VIC AB 3-0 SH 27 (SUTURE)
SUT VIC AB 3-0 SH 27X BRD (SUTURE) IMPLANT
SUT VIC AB 3-0 X1 27 (SUTURE) IMPLANT
SUT VICRYL 4-0 PS2 18IN ABS (SUTURE) IMPLANT
SYSTEM SAHARA CHEST DRAIN ATS (WOUND CARE) ×3 IMPLANT
TAPE CLOTH SURG 4X10 WHT LF (GAUZE/BANDAGES/DRESSINGS) ×6 IMPLANT
TAPE PAPER 2X10 WHT MICROPORE (GAUZE/BANDAGES/DRESSINGS) ×3 IMPLANT
TOWEL GREEN STERILE (TOWEL DISPOSABLE) ×3 IMPLANT
TOWEL GREEN STERILE FF (TOWEL DISPOSABLE) ×3 IMPLANT
TRAY FOLEY SLVR 16FR TEMP STAT (SET/KITS/TRAYS/PACK) ×3 IMPLANT
TUBING INSUFFLATION (TUBING) ×3 IMPLANT
UNDERPAD 30X30 (UNDERPADS AND DIAPERS) ×3 IMPLANT
WATER STERILE IRR 1000ML POUR (IV SOLUTION) ×6 IMPLANT

## 2018-05-19 NOTE — Anesthesia Procedure Notes (Signed)
Procedure Name: Intubation Date/Time: 05/19/2018 8:49 AM Performed by: Quentin OreWalker, Rolfe Hartsell E, CRNA Pre-anesthesia Checklist: Patient identified, Emergency Drugs available, Suction available and Patient being monitored Patient Re-evaluated:Patient Re-evaluated prior to induction Oxygen Delivery Method: Circle system utilized Preoxygenation: Pre-oxygenation with 100% oxygen Induction Type: IV induction Ventilation: Mask ventilation without difficulty and Oral airway inserted - appropriate to patient size Laryngoscope Size: Glidescope and 4 Tube type: Oral Tube size: 8.0 mm Number of attempts: 1 Airway Equipment and Method: Rigid stylet and Video-laryngoscopy Placement Confirmation: ETT inserted through vocal cords under direct vision,  positive ETCO2 and breath sounds checked- equal and bilateral Secured at: 22 cm Tube secured with: Tape Dental Injury: Teeth and Oropharynx as per pre-operative assessment  Comments: Electively used Glidescope, Grade I view on screen.

## 2018-05-19 NOTE — Brief Op Note (Signed)
05/17/2018 - 05/19/2018  8:36 AM  PATIENT:  Nicholas Chambers  57 y.o. male  PRE-OPERATIVE DIAGNOSIS:  SEVERE CAD  POST-OPERATIVE DIAGNOSIS:  SEVERE CAD  PROCEDURE:  Procedure(s):  CORONARY ARTERY BYPASS GRAFTING x 4 -LIMA to LAD -SVG to OM -SVG to PDA -SVG to PLVB  ENDOSCOPIC HARVEST GREATER SAPHENOUS VEIN -Right Leg  TRANSESOPHAGEAL ECHOCARDIOGRAM (TEE) (N/A)  SURGEON:  Surgeon(s) and Role:    * Bartle, Payton DoughtyBryan K, MD - Primary  PHYSICIAN ASSISTANT: Erin Barrett PA-C  ANESTHESIA:   general  EBL:  705 mL   BLOOD ADMINISTERED: CELLSAVER  DRAINS: Left Pleural Chest Tubes, Mediastinal Chest Drains   LOCAL MEDICATIONS USED:  NONE  SPECIMEN:  No Specimen  DISPOSITION OF SPECIMEN:  N/A  COUNTS:  YES  TOURNIQUET:  * No tourniquets in log *  DICTATION: .Dragon Dictation  PLAN OF CARE: Admit to inpatient   PATIENT DISPOSITION:  ICU - intubated and hemodynamically stable.   Delay start of Pharmacological VTE agent (>24hrs) due to surgical blood loss or risk of bleeding: yes

## 2018-05-19 NOTE — Op Note (Signed)
CARDIOVASCULAR SURGERY OPERATIVE NOTE  05/19/2018  Surgeon:  Alleen Borne, MD  First Assistant: Lowella Dandy,  PA-C   Preoperative Diagnosis:  Severe multi-vessel coronary artery disease   Postoperative Diagnosis:  Same   Procedure:  1. Median Sternotomy 2. Extracorporeal circulation 3.   Coronary artery bypass grafting x 4   Left internal mammary graft to the LAD  SVG to OM  SVG to PDA  SVG to PL 4.   Endoscopic vein harvest from the right leg   Anesthesia:  General Endotracheal   Clinical History/Surgical Indication:  He has severe 3-vessel coronary artery disease with preserved LV systolic function and has recently progressive unstable angina. I agree that CABG is the best treatment for him. I discussed the operative procedure with the patient including alternatives, benefits and risks; including but not limited to bleeding, blood transfusion, infection, stroke, myocardial infarction, graft failure, heart block requiring a permanent pacemaker, organ dysfunction, and death.  Nicholas Chambers understands and agrees to proceed.    Preparation:  The patient was seen in the preoperative holding area and the correct patient, correct operation were confirmed with the patient after reviewing the medical record and catheterization. The consent was signed by me. Preoperative antibiotics were given. A pulmonary arterial line and radial arterial line were placed by the anesthesia team. The patient was taken back to the operating room and positioned supine on the operating room table. After being placed under general endotracheal anesthesia by the anesthesia team a foley catheter was placed. The neck, chest, abdomen, and both legs were prepped with betadine soap and solution and draped in the usual sterile manner. A surgical time-out was taken and the correct patient and operative procedure  were confirmed with the nursing and anesthesia staff.  TEE: performed by Dr. Karna Christmas  Echo Findings   Left Ventricle Normal cavity size, left ventricular diastolic function and left atrial pressure. Concentric hypertrophy of mild severity. LV systolic function is normal with an EF of 55-60%. No thrombus present. No mass present.  Septum Normal atrial and ventricular septum with no septal defect and normal septal motion.  Left Atrium Cavity is mildly dilated.  Aortic Valve Structurally normal trileaflet aortic valve with no regurgitation or stenosis.  Aorta No aneurysm present. Plaque present in the descending aorta is mild (Grade 2: Mild-focal or diffuse int. thick. of 2-25mm). No graft present. No significant coarctation present. No aortic dissection present  Mitral Valve Normal valve structure. Normal transmitral gradient. No stenosis. Normal leaflet mobility. Trace regurgitation. The jet direction is centrally-directed.  Right Ventricle Normal cavity size, wall thickness and ejection fraction.  Right Atrium Normal right atrial size.  Tricuspid Valve Normal tricuspid valve structure. No stenosis. No regurgitation.  Pulmonic Valve Normal pulmonic valve structure. No stenosis. No regurgitation.  Pericardium Normal pericardium with no pericardial effusion.  Systemic Vein IVC measures <2.1 cm and collapses >50%, suggesting a normal RA pressure of 3 mmHg. Normal SVC size.  Pulmonary Artery Normal pulmonary artery with no dilation.  Post-Op TEE AORTA Aorta unchanged from pre-bypass.  LEFT VENTRICLE Left ventricle unchanged from pre-bypass.  RIGHT VENTRICLE Right ventricle unchanged from pre-bypass.  AORTIC VALVE Aortic valve unchanged from pre-bypass MITRAL VALVE Mitral valve unchanged from pre-bypass: Slight increase in mitral regurgitation.  TRICUSPID VALVE Tricuspid valve unchanged from pre-bypass    Cardiopulmonary Bypass:  A median sternotomy was performed. The pericardium  was opened in the midline. Right ventricular function appeared normal. The ascending aorta was of normal size and  had no palpable plaque. There were no contraindications to aortic cannulation or cross-clamping. The patient was fully systemically heparinized and the ACT was maintained > 400 sec. The proximal aortic arch was cannulated with a 22 F aortic cannula for arterial inflow. Venous cannulation was performed via the right atrial appendage using a two-staged venous cannula. An antegrade cardioplegia/vent cannula was inserted into the mid-ascending aorta. A retrograde cardioplegia cannula was placed into the coronary sinus via the right atrium. Aortic occlusion was performed with a single cross-clamp. Systemic cooling to 32 degrees Centigrade and topical cooling of the heart with iced saline were used. Hyperkalemic antegrade and retrograde cold blood cardioplegia was used to induce diastolic arrest and was then given at about 20 minute intervals throughout the period of arrest to maintain myocardial temperature at or below 10 degrees centigrade. A temperature probe was inserted into the interventricular septum and an insulating pad was placed in the pericardium.   Left internal mammary harvest:  The left side of the sternum was retracted using the Rultract retractor. The left internal mammary artery was harvested as a pedicle graft. All side branches were clipped. It was a medium-sized vessel of good quality with excellent blood flow. It was ligated distally and divided. It was sprayed with topical papaverine solution to prevent vasospasm.   Endoscopic vein harvest:  The right greater saphenous vein was harvested endoscopically through a 2 cm incision medial to the right knee. It was harvested from the upper thigh to below the knee. It was a medium-sized vein of good quality. The side branches were all ligated with 4-0 silk ties.    Coronary arteries:  The coronary arteries were examined.   LAD:   Small, diffusely diseased vessel. The diagonal was too small and diffusely diseased to graft  LCX:  Large OM with no distal disease  RCA:  Moderate sized PDA and smaller PL branch. The PDA was diffusely diseased but could be grafted distally.   Grafts:  1. LIMA to the LAD: 1.5 mm. It was sewn end to side using 8-0 prolene continuous suture. 2. SVG to OM:  2.0 mm. It was sewn end to side using 7-0 prolene continuous suture. 3. SVG to PDA:  1.6 mm distally. It was sewn end to side using 7-0 prolene continuous suture. 4. SVG to PL:  1.6 mm. It was sewn end to side using 7-0 prolene continuous suture.  The proximal vein graft anastomoses were performed to the mid-ascending aorta using continuous 6-0 prolene suture. Graft markers were placed around the proximal anastomoses.   Completion:  The patient was rewarmed to 37 degrees Centigrade. The clamp was removed from the LIMA pedicle and there was rapid warming of the septum and return of ventricular fibrillation. The crossclamp was removed with a time of 102 minutes. There was spontaneous return of sinus rhythm. The distal and proximal anastomoses were checked for hemostasis. The position of the grafts was satisfactory. Two temporary epicardial pacing wires were placed on the right atrium and two on the right ventricle. The patient was weaned from CPB without difficulty on no inotropes. CPB time was 125 minutes. Cardiac output was 5 LPM. TEE showed no significant change in LV systolic function with an EF of 59%. Heparin was fully reversed with protamine and the aortic and venous cannulas removed. Hemostasis was achieved. Mediastinal and left pleural drainage tubes were placed. The sternum was closed with double #6 stainless steel wires. The fascia was closed with continuous # 1 vicryl suture. The  subcutaneous tissue was closed with 2-0 vicryl continuous suture. The skin was closed with 3-0 vicryl subcuticular suture. All sponge, needle, and instrument  counts were reported correct at the end of the case. Dry sterile dressings were placed over the incisions and around the chest tubes which were connected to pleurevac suction. The patient was then transported to the surgical intensive care unit in stable condition.

## 2018-05-19 NOTE — Procedures (Signed)
Extubation Procedure Note  Patient Details:   Name: Nicholas Chambers DOB: 05-02-1961 MRN: [id number]   Airway Documentation:    Vent end date: 05/19/18 Vent end time: 1856   Evaluation  O2 sats: stable throughout Complications: No apparent complications Patient did tolerate procedure well. Bilateral Breath Sounds: Clear, Diminished   Yes   Pt extubated to 4L N/C.  No stridor noted RN @ bedside.  NIF -28, VC ~ 700mL.  Christophe LouisSteven D Graham Doukas 05/19/2018, 6:59 PM

## 2018-05-19 NOTE — Plan of Care (Signed)
  Problem: Education: Goal: Knowledge of General Education information will improve Outcome: Progressing   

## 2018-05-19 NOTE — Anesthesia Procedure Notes (Signed)
Central Venous Catheter Insertion Performed by: Roderic Palau, MD, anesthesiologist Start/End7/08/2018 7:30 AM, 05/19/2018 7:45 AM Patient location: Pre-op. Preanesthetic checklist: patient identified, IV checked, site marked, risks and benefits discussed, surgical consent, monitors and equipment checked, pre-op evaluation, timeout performed and anesthesia consent Position: Trendelenburg Lidocaine 1% used for infiltration and patient sedated Hand hygiene performed , maximum sterile barriers used  and Seldinger technique used Catheter size: 9 Fr Total catheter length 10. Central line was placed.MAC introducer Procedure performed using ultrasound guided technique. Ultrasound Notes:anatomy identified, needle tip was noted to be adjacent to the nerve/plexus identified, no ultrasound evidence of intravascular and/or intraneural injection and image(s) printed for medical record Attempts: 1 Following insertion, line sutured, dressing applied and Biopatch. Post procedure assessment: blood return through all ports, free fluid flow and no air  Patient tolerated the procedure well with no immediate complications.

## 2018-05-19 NOTE — Progress Notes (Signed)
Patient ID: Nicholas Chambers, male   DOB: 1961-10-08, 57 y.o.   MRN: [id number]  TCTS Evening Rounds:   Hemodynamically stable  CI = 1.88 on neo 10 mcg.   Has started to wake up on vent.   Urine output good  CT output low  CBC    Component Value Date/Time   WBC 11.8 (H) 05/19/2018 1448   RBC 4.11 (L) 05/19/2018 1448   HGB 12.2 (L) 05/19/2018 1448   HCT 36.9 (L) 05/19/2018 1448   PLT 154 05/19/2018 1448   MCV 89.8 05/19/2018 1448   MCH 29.7 05/19/2018 1448   MCHC 33.1 05/19/2018 1448   RDW 12.9 05/19/2018 1448     BMET    Component Value Date/Time   NA 139 05/19/2018 1338   K 3.7 05/19/2018 1338   CL 102 05/19/2018 1338   CO2 27 05/19/2018 0524   GLUCOSE 162 (H) 05/19/2018 1338   BUN 11 05/19/2018 1338   CREATININE 0.90 05/19/2018 1338   CALCIUM 8.9 05/19/2018 0524   GFRNONAA >60 05/19/2018 0524   GFRAA >60 05/19/2018 0524     A/P:  Stable postop course. Continue current plans. Wean vent as tolerated.

## 2018-05-19 NOTE — Anesthesia Postprocedure Evaluation (Signed)
Anesthesia Post Note  Patient: Elisabeth MostGregory D Lashway  Procedure(s) Performed: CORONARY ARTERY BYPASS GRAFTING (CABG) x 4 WITH ENDOSCOPIC HARVESTING OF RIGHT SAPHENOUS VEIN (N/A Chest) TRANSESOPHAGEAL ECHOCARDIOGRAM (TEE) (N/A )     Patient location during evaluation: SICU Anesthesia Type: General Level of consciousness: awake and alert Pain management: pain level controlled Vital Signs Assessment: post-procedure vital signs reviewed and stable Respiratory status: patient remains intubated per anesthesia plan and respiratory function stable Cardiovascular status: stable Postop Assessment: no apparent nausea or vomiting Anesthetic complications: no Comments: Likely extubate soon per ICU protocol.    Last Vitals:  Vitals:   05/19/18 1530 05/19/18 1600  BP: 123/82 105/76  Pulse: 80 79  Resp: 19 16  Temp: (!) 36.2 C (!) 36.1 C  SpO2: 100% 100%    Last Pain:  Vitals:   05/19/18 1500  TempSrc: Core  PainSc:                  Catheryn Baconyan P Krosby Ritchie

## 2018-05-19 NOTE — Transfer of Care (Signed)
Immediate Anesthesia Transfer of Care Note  Patient: Elisabeth MostGregory D Sneath  Procedure(s) Performed: CORONARY ARTERY BYPASS GRAFTING (CABG) x 4 WITH ENDOSCOPIC HARVESTING OF RIGHT SAPHENOUS VEIN (N/A Chest) TRANSESOPHAGEAL ECHOCARDIOGRAM (TEE) (N/A )  Patient Location: SICU  Anesthesia Type:General  Level of Consciousness: sedated and Patient remains intubated per anesthesia plan  Airway & Oxygen Therapy: Patient remains intubated per anesthesia plan  Post-op Assessment: Report given to RN and Post -op Vital signs reviewed and stable  Post vital signs: Reviewed and stable  Last Vitals:  Vitals Value Taken Time  BP    Temp    Pulse    Resp    SpO2      Last Pain:  Vitals:   05/19/18 0531  TempSrc: Oral  PainSc:       Patients Stated Pain Goal: 0 (05/18/18 2000)  Complications: No apparent anesthesia complications

## 2018-05-19 NOTE — Anesthesia Procedure Notes (Signed)
Central Venous Catheter Insertion Performed by: Gaynelle AduFitzgerald, Neal Trulson, MD, anesthesiologist Start/End7/08/2018 7:30 AM, 05/19/2018 7:45 AM Patient location: Pre-op. Preanesthetic checklist: patient identified, IV checked, site marked, risks and benefits discussed, surgical consent, monitors and equipment checked, pre-op evaluation, timeout performed and anesthesia consent Hand hygiene performed  and maximum sterile barriers used  PA cath was placed.Swan type:thermodilution PA Cath depth:50 Procedure performed without using ultrasound guided technique. Attempts: 1 Patient tolerated the procedure well with no immediate complications.

## 2018-05-19 NOTE — Anesthesia Procedure Notes (Signed)
Arterial Line Insertion Start/End7/08/2018 7:50 AM, 05/19/2018 7:57 AM Performed by: Quentin OreWalker, Cay[place] Brogden E, CRNA, CRNA  Patient location: Pre-op. Preanesthetic checklist: patient identified, IV checked, site marked, risks and benefits discussed, surgical consent, monitors and equipment checked, pre-op evaluation and timeout performed Lidocaine 1% used for infiltration and patient sedated Left, radial was placed Catheter size: 20 G Hand hygiene performed , maximum sterile barriers used  and Seldinger technique used Allen's test indicative of satisfactory collateral circulation Attempts: 1 Procedure performed without using ultrasound guided technique. Following insertion, Biopatch and dressing applied. Post procedure assessment: normal  Patient tolerated the procedure well with no immediate complications.

## 2018-05-19 NOTE — Progress Notes (Signed)
  Echocardiogram Echocardiogram Transesophageal has been performed.  Shelvy Heckert R 05/19/2018, 10:55 AM 

## 2018-05-20 ENCOUNTER — Inpatient Hospital Stay (HOSPITAL_COMMUNITY): Payer: BLUE CROSS/BLUE SHIELD

## 2018-05-20 ENCOUNTER — Encounter (HOSPITAL_COMMUNITY): Payer: Self-pay | Admitting: Surgery

## 2018-05-20 LAB — CREATININE, SERUM
Creatinine, Ser: 1.17 mg/dL (ref 0.61–1.24)
GFR calc non Af Amer: >60 mL/min (ref >60)

## 2018-05-20 LAB — CBC
HCT: 34.5 % — ABNORMAL LOW (ref 39.0–52.0)
HEMATOCRIT: 35.3 % — AB (ref 39.0–52.0)
Hemoglobin: 11.1 g/dL — ABNORMAL LOW (ref 13.0–17.0)
Hemoglobin: 11.1 g/dL — ABNORMAL LOW (ref 13.0–17.0)
MCH: 29 pg (ref 26.0–34.0)
MCH: 29.1 pg (ref 26.0–34.0)
MCHC: 32.2 g/dL (ref 30.0–36.0)
MCHC: 31.4 g/dL (ref 30.0–36.0)
MCV: 90.1 fL (ref 78.0–100.0)
MCV: 92.4 fL (ref 78.0–100.0)
Platelets: 153 10*3/uL (ref 150–400)
Platelets: 145 10*3/uL — ABNORMAL LOW (ref 150–400)
RBC: 3.83 MIL/uL — AB (ref 4.22–5.81)
RBC: 3.82 MIL/uL — ABNORMAL LOW (ref 4.22–5.81)
RDW: 13.2 % (ref 11.5–15.5)
RDW: 13.3 % (ref 11.5–15.5)
WBC: 9.8 10*3/uL (ref 4.0–10.5)
WBC: 10.5 10*3/uL (ref 4.0–10.5)

## 2018-05-20 LAB — GLUCOSE, CAPILLARY
GLUCOSE-CAPILLARY: 132 mg/dL — AB (ref 70–99)
GLUCOSE-CAPILLARY: 120 mg/dL — AB (ref 70–99)
GLUCOSE-CAPILLARY: 115 mg/dL — AB (ref 70–99)
GLUCOSE-CAPILLARY: 112 mg/dL — AB (ref 70–99)
GLUCOSE-CAPILLARY: 119 mg/dL — AB (ref 70–99)
GLUCOSE-CAPILLARY: 110 mg/dL — AB (ref 70–99)
GLUCOSE-CAPILLARY: 161 mg/dL — AB (ref 70–99)
GLUCOSE-CAPILLARY: 185 mg/dL — AB (ref 70–99)
GLUCOSE-CAPILLARY: 138 mg/dL — AB (ref 70–99)
GLUCOSE-CAPILLARY: 117 mg/dL — AB (ref 70–99)
GLUCOSE-CAPILLARY: 123 mg/dL — AB (ref 70–99)
GLUCOSE-CAPILLARY: 151 mg/dL — AB (ref 70–99)
Glucose-Capillary: 129 mg/dL — ABNORMAL HIGH (ref 70–99)
Glucose-Capillary: 111 mg/dL — ABNORMAL HIGH (ref 70–99)
Glucose-Capillary: 102 mg/dL — ABNORMAL HIGH (ref 70–99)
Glucose-Capillary: 157 mg/dL — ABNORMAL HIGH (ref 70–99)

## 2018-05-20 LAB — MAGNESIUM
MAGNESIUM: 2.5 mg/dL — AB (ref 1.7–2.4)
Magnesium: 2.2 mg/dL (ref 1.7–2.4)

## 2018-05-20 LAB — BASIC METABOLIC PANEL
Anion gap: 9 (ref 5–15)
BUN: 9 mg/dL (ref 6–20)
CO2: 26 mmol/L (ref 22–32)
CREATININE: 1.02 mg/dL (ref 0.61–1.24)
Calcium: 7.6 mg/dL — ABNORMAL LOW (ref 8.9–10.3)
Chloride: 105 mmol/L (ref 98–111)
GFR calc Af Amer: >60 mL/min (ref >60)
Glucose, Bld: 118 mg/dL — ABNORMAL HIGH (ref 70–99)
Potassium: 3.9 mmol/L (ref 3.5–5.1)
SODIUM: 140 mmol/L (ref 135–145)

## 2018-05-20 LAB — POCT I-STAT, CHEM 8
BUN: 9 mg/dL (ref 6–20)
Calcium, Ion: 1.14 mmol/L — ABNORMAL LOW (ref 1.15–1.40)
Chloride: 100 mmol/L (ref 98–111)
Creatinine, Ser: 1.1 mg/dL (ref 0.61–1.24)
GLUCOSE: 159 mg/dL — AB (ref 70–99)
HCT: 32 % — ABNORMAL LOW (ref 39.0–52.0)
HEMOGLOBIN: 10.9 g/dL — AB (ref 13.0–17.0)
Potassium: 4 mmol/L (ref 3.5–5.1)
SODIUM: 139 mmol/L (ref 135–145)
TCO2: 26 mmol/L (ref 22–32)

## 2018-05-20 LAB — POCT I-STAT 4, (NA,K, GLUC, HGB,HCT)
Glucose, Bld: 149 mg/dL — ABNORMAL HIGH (ref 70–99)
HCT: 32 % — ABNORMAL LOW (ref 39.0–52.0)
Hemoglobin: 10.9 g/dL — ABNORMAL LOW (ref 13.0–17.0)
Potassium: 3.5 mmol/L (ref 3.5–5.1)
Sodium: 139 mmol/L (ref 135–145)

## 2018-05-20 MED ORDER — CHLORHEXIDINE GLUCONATE CLOTH 2 % EX PADS
6.0000 | MEDICATED_PAD | Freq: Every day | CUTANEOUS | Status: DC
  Administered 2018-05-20: 6 via TOPICAL

## 2018-05-20 MED ORDER — INSULIN DETEMIR 100 UNIT/ML ~~LOC~~ SOLN: 20.0000 [IU] | Freq: Every day | SUBCUTANEOUS | Status: DC

## 2018-05-20 MED ORDER — FUROSEMIDE 10 MG/ML IJ SOLN
INTRAMUSCULAR | Status: AC
  Filled 2018-05-20: qty 4

## 2018-05-20 MED ORDER — ENOXAPARIN SODIUM 40 MG/0.4ML ~~LOC~~ SOLN
40.0000 mg | Freq: Every day | SUBCUTANEOUS | Status: DC
  Administered 2018-05-20 – 2018-05-22 (×3): 40 mg via SUBCUTANEOUS
  Filled 2018-05-20 (×3): qty 0.4

## 2018-05-20 MED ORDER — INSULIN ASPART 100 UNIT/ML ~~LOC~~ SOLN
0.0000 [IU] | SUBCUTANEOUS | Status: DC
  Administered 2018-05-20 (×3): 2 [IU] via SUBCUTANEOUS
  Administered 2018-05-21 (×2): 4 [IU] via SUBCUTANEOUS
  Administered 2018-05-21: 2 [IU] via SUBCUTANEOUS

## 2018-05-20 MED ORDER — FUROSEMIDE 10 MG/ML IJ SOLN
40.0000 mg | Freq: Once | INTRAMUSCULAR | Status: AC
  Administered 2018-05-20: 40 mg via INTRAVENOUS

## 2018-05-20 MED ORDER — INSULIN DETEMIR 100 UNIT/ML ~~LOC~~ SOLN
20.0000 [IU] | Freq: Every day | SUBCUTANEOUS | Status: DC
  Administered 2018-05-20 – 2018-05-21 (×2): 20 [IU] via SUBCUTANEOUS
  Filled 2018-05-20 (×4): qty 0.2

## 2018-05-20 MED ORDER — SODIUM CHLORIDE 0.9% FLUSH: 10.0000 mL | INTRAVENOUS | Status: DC | PRN

## 2018-05-20 MED ORDER — SODIUM CHLORIDE 0.9% FLUSH
10.0000 mL | Freq: Two times a day (BID) | INTRAVENOUS | Status: DC
  Administered 2018-05-20: 10 mL

## 2018-05-20 MED FILL — Magnesium Sulfate Inj 50%: INTRAMUSCULAR | Qty: 10 | Status: AC

## 2018-05-20 MED FILL — Heparin Sodium (Porcine) Inj 1000 Unit/ML: INTRAMUSCULAR | Qty: 30 | Status: AC

## 2018-05-20 MED FILL — Potassium Chloride Inj 2 mEq/ML: INTRAVENOUS | Qty: 40 | Status: AC

## 2018-05-20 NOTE — Progress Notes (Signed)
      [address]       [name]             [phone]      POD # 1 CABG x 4  Sleeping currently  BP 96/63   Pulse 85   Temp 99.4 F (37.4 C) (Oral)   Resp (!) 25   Ht 5\' 9"  (1.753 m)   Wt 283 lb 8 oz (128.6 kg)   SpO2 96%   BMI 41.87 kg/m   Intake/Output Summary (Last 24 hours) at 05/20/2018 1819 Last data filed at 05/20/2018 1800 Gross per 24 hour  Intake 2590.9 ml  Output 2070 ml  Net 520.9 ml   Hct= 32, creatinine 1.1, K= 4.0  Doing well POD # 1  Casondra Gasca C. Dorris FetchHendrickson, MD [facility] [phone]

## 2018-05-20 NOTE — Progress Notes (Signed)
1 Day Post-Op Procedure(s) (LRB): CORONARY ARTERY BYPASS GRAFTING (CABG) x 4 WITH ENDOSCOPIC HARVESTING OF RIGHT SAPHENOUS VEIN (N/A) TRANSESOPHAGEAL ECHOCARDIOGRAM (TEE) (N/A) Subjective: No complaints  Objective: Vital signs in last 24 hours: Temp:  [97 F (36.1 C)-100 F (37.8 C)] 100 F (37.8 C) (07/11 0700) Pulse Rate:  [79-88] 79 (07/11 0700) Cardiac Rhythm: Normal sinus rhythm (07/11 0400) Resp:  [12-22] 18 (07/11 0700) BP: (85-123)/(50-88) 100/57 (07/11 0700) SpO2:  [98 %-100 %] 99 % (07/11 0700) Arterial Line BP: (95-129)/(52-77) 129/60 (07/11 0700) FiO2 (%):  [40 %-50 %] 40 % (07/10 1753) Weight:  [128.6 kg (283 lb 8 oz)] 128.6 kg (283 lb 8 oz) (07/11 0500)  Hemodynamic parameters for last 24 hours: PAP: (23-41)/(10-25) 28/12 CO:  [4 L/min-6 L/min] 6 L/min CI:  [1.7 L/min/m2-2.5 L/min/m2] 2.5 L/min/m2  Intake/Output from previous day: 07/10 0701 - 07/11 0700 In: 5745 [P.O.:50; I.V.:3169.8; Blood:530; NG/GT:30; IV Piggyback:1965.1] Out: 3495 [Urine:2135; Emesis/NG output:50; Blood:705; Chest Tube:605] Intake/Output this shift: No intake/output data recorded.  General appearance: alert and cooperative Neurologic: intact Heart: regular rate and rhythm, S1, S2 normal, no murmur, click, rub or gallop Lungs: clear to auscultation bilaterally Extremities: edema mild Wound: dressings dry  Lab Results: Recent Labs    05/19/18 2010 05/19/18 2018 05/20/18 0416  WBC 10.2  --  9.8  HGB 11.4* 10.9* 11.1*  HCT 34.5* 32.0* 34.5*  PLT 149*  --  153   BMET:  Recent Labs    05/19/18 0524  05/19/18 2018 05/20/18 0416  NA 139   < > 141 140  K 3.9   < > 4.2 3.9  CL 101   < > 103 105  CO2 27  --   --  26  GLUCOSE 143*   < > 128* 118*  BUN 14   < > 11 9  CREATININE 1.17   < > 0.90 1.02  CALCIUM 8.9  --   --  7.6*   < > = values in this interval not displayed.    PT/INR:  Recent Labs    05/19/18 1448  LABPROT 15.9*  INR 1.28   ABG    Component Value  Date/Time   PHART 7.343 (L) 05/19/2018 2024   HCO3 25.9 05/19/2018 2024   TCO2 27 05/19/2018 2024   ACIDBASEDEF 1.0 05/19/2018 1851   O2SAT 98.0 05/19/2018 2024   CBG (last 3)  Recent Labs    05/20/18 0313 05/20/18 0419 05/20/18 0647  GLUCAP 111* 112* 102*   CXR: mild left base atelectasis  ECG: sinus, early repol  Assessment/Plan: S/P Procedure(s) (LRB): CORONARY ARTERY BYPASS GRAFTING (CABG) x 4 WITH ENDOSCOPIC HARVESTING OF RIGHT SAPHENOUS VEIN (N/A) TRANSESOPHAGEAL ECHOCARDIOGRAM (TEE) (N/A)   POD 1 Hemodynamically stable in sinus rhythm on low dose neo. Chest tube output still significant overnight, thin serosanguinous. Will keep chest tubes in this am and probably remove later today. Mobilize, IS Diuresis once off neo Diabetes control: Preop Hgb A1c was 7.9. Start Levemir and SSI. Continue foley due to diuresing patient and patient in ICU See progression orders   LOS: 2 days    Alleen BorneBryan K Cornella Emmer 05/20/2018

## 2018-05-20 NOTE — Plan of Care (Signed)

## 2018-05-21 ENCOUNTER — Inpatient Hospital Stay (HOSPITAL_COMMUNITY): Payer: BLUE CROSS/BLUE SHIELD

## 2018-05-21 DIAGNOSIS — I4891 Unspecified atrial fibrillation: Secondary | ICD-10-CM

## 2018-05-21 DIAGNOSIS — I480 Paroxysmal atrial fibrillation: Secondary | ICD-10-CM

## 2018-05-21 HISTORY — DX: Unspecified atrial fibrillation: I48.91

## 2018-05-21 LAB — BASIC METABOLIC PANEL
ANION GAP: 7 (ref 5–15)
BUN: 8 mg/dL (ref 6–20)
CALCIUM: 8.1 mg/dL — AB (ref 8.9–10.3)
CO2: 28 mmol/L (ref 22–32)
Chloride: 102 mmol/L (ref 98–111)
Creatinine, Ser: 1.14 mg/dL (ref 0.61–1.24)
GFR calc Af Amer: >60 mL/min (ref >60)
GFR calc non Af Amer: >60 mL/min (ref >60)
GLUCOSE: 174 mg/dL — AB (ref 70–99)
Potassium: 3.7 mmol/L (ref 3.5–5.1)
Sodium: 137 mmol/L (ref 135–145)

## 2018-05-21 LAB — CBC
HCT: 32.4 % — ABNORMAL LOW (ref 39.0–52.0)
HEMOGLOBIN: 10.4 g/dL — AB (ref 13.0–17.0)
MCH: 29.5 pg (ref 26.0–34.0)
MCHC: 32.1 g/dL (ref 30.0–36.0)
MCV: 92 fL (ref 78.0–100.0)
Platelets: 128 10*3/uL — ABNORMAL LOW (ref 150–400)
RBC: 3.52 MIL/uL — ABNORMAL LOW (ref 4.22–5.81)
RDW: 13.7 % (ref 11.5–15.5)
WBC: 10.7 10*3/uL — ABNORMAL HIGH (ref 4.0–10.5)

## 2018-05-21 LAB — GLUCOSE, CAPILLARY
GLUCOSE-CAPILLARY: 144 mg/dL — AB (ref 70–99)
GLUCOSE-CAPILLARY: 162 mg/dL — AB (ref 70–99)
GLUCOSE-CAPILLARY: 162 mg/dL — AB (ref 70–99)
Glucose-Capillary: 179 mg/dL — ABNORMAL HIGH (ref 70–99)
Glucose-Capillary: 164 mg/dL — ABNORMAL HIGH (ref 70–99)
Glucose-Capillary: 134 mg/dL — ABNORMAL HIGH (ref 70–99)

## 2018-05-21 MED ORDER — POTASSIUM CHLORIDE CRYS ER 20 MEQ PO TBCR
20.0000 meq | EXTENDED_RELEASE_TABLET | Freq: Two times a day (BID) | ORAL | Status: DC
  Administered 2018-05-21 – 2018-05-23 (×4): 20 meq via ORAL
  Filled 2018-05-21 (×4): qty 1

## 2018-05-21 MED ORDER — POTASSIUM CHLORIDE 10 MEQ/50ML IV SOLN
10.0000 meq | INTRAVENOUS | Status: AC | PRN
  Administered 2018-05-21 (×2): 10 meq via INTRAVENOUS
  Filled 2018-05-21: qty 50

## 2018-05-21 MED ORDER — BISACODYL 5 MG PO TBEC
10.0000 mg | DELAYED_RELEASE_TABLET | Freq: Every day | ORAL | Status: DC | PRN
Start: 2018-05-21 — End: 2018-05-23
  Administered 2018-05-21: 10 mg via ORAL

## 2018-05-21 MED ORDER — POTASSIUM CHLORIDE 10 MEQ/50ML IV SOLN
10.0000 meq | INTRAVENOUS | Status: DC | PRN
  Administered 2018-05-21: 10 meq via INTRAVENOUS
  Filled 2018-05-21 (×2): qty 50

## 2018-05-21 MED ORDER — INSULIN ASPART 100 UNIT/ML ~~LOC~~ SOLN
0.0000 [IU] | Freq: Three times a day (TID) | SUBCUTANEOUS | Status: DC
  Administered 2018-05-21: 2 [IU] via SUBCUTANEOUS
  Administered 2018-05-21 (×2): 4 [IU] via SUBCUTANEOUS
  Administered 2018-05-22 (×2): 2 [IU] via SUBCUTANEOUS
  Administered 2018-05-22 (×2): 4 [IU] via SUBCUTANEOUS
  Administered 2018-05-23: 2 [IU] via SUBCUTANEOUS

## 2018-05-21 MED ORDER — LEVOTHYROXINE SODIUM 50 MCG PO TABS
50.0000 ug | ORAL_TABLET | Freq: Every day | ORAL | Status: DC
  Administered 2018-05-21 – 2018-05-23 (×3): 50 ug via ORAL
  Filled 2018-05-21 (×3): qty 1

## 2018-05-21 MED ORDER — DOCUSATE SODIUM 100 MG PO CAPS
200.0000 mg | ORAL_CAPSULE | Freq: Every day | ORAL | Status: DC
  Administered 2018-05-21 – 2018-05-23 (×3): 200 mg via ORAL
  Filled 2018-05-21 (×2): qty 2

## 2018-05-21 MED ORDER — SODIUM CHLORIDE 0.9 % IV SOLN: 250.0000 mL | INTRAVENOUS | Status: DC | PRN

## 2018-05-21 MED ORDER — AMIODARONE HCL IN DEXTROSE 360-4.14 MG/200ML-% IV SOLN
INTRAVENOUS | Status: AC
  Filled 2018-05-21: qty 200

## 2018-05-21 MED ORDER — ONDANSETRON HCL 4 MG PO TABS: 4.0000 mg | ORAL_TABLET | Freq: Four times a day (QID) | ORAL | Status: DC | PRN

## 2018-05-21 MED ORDER — AMIODARONE HCL IN DEXTROSE 360-4.14 MG/200ML-% IV SOLN
30.0000 mg/h | INTRAVENOUS | Status: DC
  Administered 2018-05-21: 30 mg/h via INTRAVENOUS

## 2018-05-21 MED ORDER — ONDANSETRON HCL 4 MG/2ML IJ SOLN
4.0000 mg | Freq: Four times a day (QID) | INTRAMUSCULAR | Status: DC | PRN
  Administered 2018-05-21 (×2): 4 mg via INTRAVENOUS
  Filled 2018-05-21 (×2): qty 2

## 2018-05-21 MED ORDER — METOLAZONE 5 MG PO TABS
2.5000 mg | ORAL_TABLET | Freq: Every day | ORAL | Status: AC
  Administered 2018-05-21 – 2018-05-22 (×2): 2.5 mg via ORAL
  Filled 2018-05-21 (×2): qty 1

## 2018-05-21 MED ORDER — ACETAMINOPHEN 325 MG PO TABS: 650.0000 mg | ORAL_TABLET | Freq: Four times a day (QID) | ORAL | Status: DC | PRN

## 2018-05-21 MED ORDER — PANTOPRAZOLE SODIUM 40 MG PO TBEC
40.0000 mg | DELAYED_RELEASE_TABLET | Freq: Every day | ORAL | Status: DC
  Administered 2018-05-21 – 2018-05-23 (×3): 40 mg via ORAL
  Filled 2018-05-21 (×2): qty 1

## 2018-05-21 MED ORDER — MOVING RIGHT ALONG BOOK
Freq: Once | Status: AC
  Administered 2018-05-21: 1
  Filled 2018-05-21: qty 1

## 2018-05-21 MED ORDER — METOPROLOL TARTRATE 12.5 MG HALF TABLET
12.5000 mg | ORAL_TABLET | Freq: Two times a day (BID) | ORAL | Status: DC
  Administered 2018-05-21 – 2018-05-23 (×5): 12.5 mg via ORAL
  Filled 2018-05-21 (×4): qty 1

## 2018-05-21 MED ORDER — AMIODARONE HCL IN DEXTROSE 360-4.14 MG/200ML-% IV SOLN
60.0000 mg/h | INTRAVENOUS | Status: DC
  Administered 2018-05-21 (×2): 60 mg/h via INTRAVENOUS
  Filled 2018-05-21: qty 200

## 2018-05-21 MED ORDER — FUROSEMIDE 40 MG PO TABS
40.0000 mg | ORAL_TABLET | Freq: Every day | ORAL | Status: AC
  Administered 2018-05-21 – 2018-05-23 (×3): 40 mg via ORAL
  Filled 2018-05-21 (×3): qty 1

## 2018-05-21 MED ORDER — SODIUM CHLORIDE 0.9% FLUSH: 3.0000 mL | INTRAVENOUS | Status: DC | PRN

## 2018-05-21 MED ORDER — OXYCODONE HCL 5 MG PO TABS
5.0000 mg | ORAL_TABLET | ORAL | Status: DC | PRN
  Administered 2018-05-21 – 2018-05-22 (×3): 10 mg via ORAL
  Filled 2018-05-21 (×3): qty 2

## 2018-05-21 MED ORDER — SODIUM CHLORIDE 0.9% FLUSH
3.0000 mL | Freq: Two times a day (BID) | INTRAVENOUS | Status: DC
  Administered 2018-05-21 – 2018-05-22 (×4): 3 mL via INTRAVENOUS

## 2018-05-21 MED ORDER — AMIODARONE LOAD VIA INFUSION
150.0000 mg | Freq: Once | INTRAVENOUS | Status: AC
  Administered 2018-05-21: 150 mg via INTRAVENOUS
  Filled 2018-05-21: qty 83.34

## 2018-05-21 MED ORDER — GLIMEPIRIDE 4 MG PO TABS
2.0000 mg | ORAL_TABLET | Freq: Every day | ORAL | Status: DC
  Administered 2018-05-21 – 2018-05-23 (×3): 2 mg via ORAL
  Filled 2018-05-21 (×3): qty 1

## 2018-05-21 MED ORDER — AMIODARONE HCL 200 MG PO TABS
400.0000 mg | ORAL_TABLET | Freq: Two times a day (BID) | ORAL | Status: DC
  Administered 2018-05-21 – 2018-05-23 (×5): 400 mg via ORAL
  Filled 2018-05-21 (×6): qty 2

## 2018-05-21 MED ORDER — ASPIRIN EC 325 MG PO TBEC
325.0000 mg | DELAYED_RELEASE_TABLET | Freq: Every day | ORAL | Status: DC
  Administered 2018-05-21 – 2018-05-23 (×3): 325 mg via ORAL
  Filled 2018-05-21 (×2): qty 1

## 2018-05-21 MED ORDER — TRAMADOL HCL 50 MG PO TABS: 50.0000 mg | ORAL_TABLET | Freq: Four times a day (QID) | ORAL | Status: DC | PRN

## 2018-05-21 MED ORDER — BISACODYL 10 MG RE SUPP: 10.0000 mg | Freq: Every day | RECTAL | Status: DC | PRN

## 2018-05-21 MED FILL — Mannitol IV Soln 20%: INTRAVENOUS | Qty: 500 | Status: AC

## 2018-05-21 MED FILL — Electrolyte-R (PH 7.4) Solution: INTRAVENOUS | Qty: 4000 | Status: AC

## 2018-05-21 MED FILL — Sodium Chloride IV Soln 0.9%: INTRAVENOUS | Qty: 2000 | Status: AC

## 2018-05-21 MED FILL — Sodium Bicarbonate IV Soln 8.4%: INTRAVENOUS | Qty: 50 | Status: AC

## 2018-05-21 MED FILL — Heparin Sodium (Porcine) Inj 1000 Unit/ML: INTRAMUSCULAR | Qty: 20 | Status: AC

## 2018-05-21 MED FILL — Lidocaine HCl(Cardiac) IV PF Soln Pref Syr 100 MG/5ML (2%): INTRAVENOUS | Qty: 5 | Status: AC

## 2018-05-21 NOTE — Discharge Instructions (Signed)
Endoscopic Saphenous Vein Harvesting, Care After °Refer to this sheet in the next few weeks. These instructions provide you with information about caring for yourself after your procedure. Your health care provider may also give you more specific instructions. Your treatment has been planned according to current medical practices, but problems sometimes occur. Call your health care provider if you have any problems or questions after your procedure. °What can I expect after the procedure? °After the procedure, it is common to have: °· Pain. °· Bruising. °· Swelling. °· Numbness. ° °Follow these instructions at home: °Medicine °· Take over-the-counter and prescription medicines only as told by your health care provider. °· Do not drive or operate heavy machinery while taking prescription pain medicine. °Incision care ° °· Follow instructions from your health care provider about how to take care of the cut made during surgery (incision). Make sure you: °? Wash your hands with soap and water before you change your bandage (dressing). If soap and water are not available, use hand sanitizer. °? Change your dressing as told by your health care provider. °? Leave stitches (sutures), skin glue, or adhesive strips in place. These skin closures may need to be in place for 2 weeks or longer. If adhesive strip edges start to loosen and curl up, you may trim the loose edges. Do not remove adhesive strips completely unless your health care provider tells you to do that. °· Check your incision area every day for signs of infection. Check for: °? More redness, swelling, or pain. °? More fluid or blood. °? Warmth. °? Pus or a bad smell. °General instructions °· Raise (elevate) your legs above the level of your heart while you are sitting or lying down. °· Do any exercises your health care providers have given you. These may include deep breathing, coughing, and walking exercises. °· Do not shower, take baths, swim, or use a hot tub  unless told by your health care provider. °· Wear your elastic stocking if told by your health care provider. °· Keep all follow-up visits as told by your health care provider. This is important. °Contact a health care provider if: °· Medicine does not help your pain. °· Your pain gets worse. °· You have new leg bruises or your leg bruises get bigger. °· You have a fever. °· Your leg feels numb. °· You have more redness, swelling, or pain around your incision. °· You have more fluid or blood coming from your incision. °· Your incision feels warm to the touch. °· You have pus or a bad smell coming from your incision. °Get help right away if: °· Your pain is severe. °· You develop pain, tenderness, warmth, redness, or swelling in any part of your leg. °· You have chest pain. °· You have trouble breathing. °This information is not intended to replace advice given to you by your health care provider. Make sure you discuss any questions you have with your health care provider. °Document Released: 07/09/2011 Document Revised: 04/03/2016 Document Reviewed: 09/10/2015 °Elsevier Interactive Patient Education © 2018 [employer] °Coronary Artery Bypass Grafting, Care After °These instructions give you information on caring for yourself after your procedure. Your doctor may also give you more specific instructions. Call your doctor if you have any problems or questions after your procedure. °Follow these instructions at home: °· Only take medicine as told by your doctor. Take medicines exactly as told. Do not stop taking medicines or start any new medicines without talking to your doctor first. °·   Take your pulse as told by your doctor. °· Do deep breathing as told by your doctor. Use your breathing device (incentive spirometer), if given, to practice deep breathing several times a day. Support your chest with a pillow or your arms when you take deep breaths or cough. °· Keep the area clean, dry, and protected where the  surgery cuts (incisions) were made. Remove bandages (dressings) only as told by your doctor. If strips were applied to surgical area, do not take them off. They fall off on their own. °· Check the surgery area daily for puffiness (swelling), redness, or leaking fluid. °· If surgery cuts were made in your legs: °? Avoid crossing your legs. °? Avoid sitting for long periods of time. Change positions every 30 minutes. °? Raise your legs when you are sitting. Place them on pillows. °· Wear stockings that help keep blood clots from forming in your legs (compression stockings). °· Only take sponge baths until your doctor says it is okay to take showers. Pat the surgery area dry. Do not rub the surgery area with a washcloth or towel. Do not bathe, swim, or use a hot tub until your doctor says it is okay. °· Eat foods that are high in fiber. These include raw fruits and vegetables, whole grains, beans, and nuts. Choose lean meats. Avoid canned, processed, and fried foods. °· Drink enough fluids to keep your pee (urine) clear or pale yellow. °· Weigh yourself every day. °· Rest and limit activity as told by your doctor. You may be told to: °? Stop any activity if you have chest pain, shortness of breath, changes in heartbeat, or dizziness. Get help right away if this happens. °? Move around often for short amounts of time or take short walks as told by your doctor. Gradually become more active. You may need help to strengthen your muscles and build endurance. °? Avoid lifting, pushing, or pulling anything heavier than 10 pounds (4.5 kg) for at least 6 weeks after surgery. °· Do not drive until your doctor says it is okay. °· Ask your doctor when you can go back to work. °· Ask your doctor when you can begin sexual activity again. °· Follow up with your doctor as told. °Contact a doctor if: °· You have puffiness, redness, more pain, or fluid draining from the incision site. °· You have a fever. °· You have puffiness in your  ankles or legs. °· You have pain in your legs. °· You gain 2 or more pounds (0.9 kg) a day. °· You feel sick to your stomach (nauseous) or throw up (vomit). °· You have watery poop (diarrhea). °Get help right away if: °· You have chest pain that goes to your jaw or arms. °· You have shortness of breath. °· You have a fast or irregular heartbeat. °· You notice a "clicking" in your breastbone when you move. °· You have numbness or weakness in your arms or legs. °· You feel dizzy or light-headed. °This information is not intended to replace advice given to you by your health care provider. Make sure you discuss any questions you have with your health care provider. °Document Released: 11/01/2013 Document Revised: 04/03/2016 Document Reviewed: 04/05/2013 °Elsevier Interactive Patient Education © 2017 [employer] ° °

## 2018-05-21 NOTE — Progress Notes (Signed)
2 Days Post-Op Procedure(s) (LRB): CORONARY ARTERY BYPASS GRAFTING (CABG) x 4 WITH ENDOSCOPIC HARVESTING OF RIGHT SAPHENOUS VEIN (N/A) TRANSESOPHAGEAL ECHOCARDIOGRAM (TEE) (N/A) Subjective: No complaints  Went into atrial fib early this am and was started on amio with rapid conversion to sinus.  Objective: Vital signs in last 24 hours: Temp:  [98.3 F (36.8 C)-99.7 F (37.6 C)] 99 F (37.2 C) (07/12 0418) Pulse Rate:  [74-93] 91 (07/12 0800) Cardiac Rhythm: Normal sinus rhythm (07/12 0400) Resp:  [16-37] 37 (07/12 0800) BP: (84-155)/(53-89) 119/69 (07/12 0800) SpO2:  [88 %-100 %] 93 % (07/12 0800) Arterial Line BP: (132)/(63) 132/63 (07/11 0900) Weight:  [127 kg (279 lb 15.8 oz)] 127 kg (279 lb 15.8 oz) (07/12 0500)  Hemodynamic parameters for last 24 hours: PAP: (30)/(14) 30/14  Intake/Output from previous day: 07/11 0701 - 07/12 0700 In: 1906.2 [P.O.:960; I.V.:746.3; IV Piggyback:199.9] Out: 1595 [Urine:1375; Chest Tube:220] Intake/Output this shift: No intake/output data recorded.  General appearance: alert and cooperative Neurologic: intact Heart: regular rate and rhythm, S1, S2 normal, no murmur, click, rub or gallop Lungs: diminished breath sounds bibasilar Extremities: edema mild Wound: dressings dry  Lab Results: Recent Labs    05/20/18 1550 05/20/18 1556 05/21/18 0400  WBC 10.5  --  10.7*  HGB 11.1* 10.9* 10.4*  HCT 35.3* 32.0* 32.4*  PLT 145*  --  128*   BMET:  Recent Labs    05/20/18 0416  05/20/18 1556 05/21/18 0400  NA 140  --  139 137  Chambers 3.9  --  4.0 3.7  CL 105  --  100 102  CO2 26  --   --  28  GLUCOSE 118*  --  159* 174*  BUN 9  --  9 8  CREATININE 1.02   < > 1.10 1.14  CALCIUM 7.6*  --   --  8.1*   < > = values in this interval not displayed.    PT/INR:  Recent Labs    05/19/18 1448  LABPROT 15.9*  INR 1.28   ABG    Component Value Date/Time   PHART 7.343 (L) 05/19/2018 2024   HCO3 25.9 05/19/2018 2024   TCO2 26  05/20/2018 1556   ACIDBASEDEF 1.0 05/19/2018 1851   O2SAT 98.0 05/19/2018 2024   CBG (last 3)  Recent Labs    05/21/18 0001 05/21/18 0415 05/21/18 0749  GLUCAP 144* 162* 179*   CXR: bibasilar atelectasis.  Assessment/Plan: S/P Procedure(s) (LRB): CORONARY ARTERY BYPASS GRAFTING (CABG) x 4 WITH ENDOSCOPIC HARVESTING OF RIGHT SAPHENOUS VEIN (N/A) TRANSESOPHAGEAL ECHOCARDIOGRAM (TEE) (N/A)  POD 2 Hemodynamically stable in sinus rhythm. Continue low dose Lopressor. Postop atrial fibrillation converted quickly with IV amio. Will switch to po this am and dc drip so sleeve can be removed. DM: glucose under adequate control. Will resume Amaryl and continue Levemir and SSI. His Hgb A1c was 7.9 so he is going to require some medication adjustment in addition to dietary modification and exercise/wt loss. I would probably start some Metformin tomorrow and see how the combination of Metformin and Amaryl works for him.   Continue IS, ambulation Transfer to 4E.   LOS: 3 days    Nicholas Chambers Chambers 05/21/2018

## 2018-05-21 NOTE — Progress Notes (Signed)
Progress Note  Patient Name: Nicholas Chambers Date of Encounter: 05/21/2018  Primary Cardiologist: No primary care provider on file.   Subjective   Feels well, no chest pain or shortness of breath  Inpatient Medications    Scheduled Meds: . amiodarone  400 mg Oral BID  . aspirin EC  325 mg Oral Daily  . atorvastatin  80 mg Oral q1800  . docusate sodium  200 mg Oral Daily  . enoxaparin (LOVENOX) injection  40 mg Subcutaneous QHS  . furosemide  40 mg Oral Daily  . glimepiride  2 mg Oral Q breakfast  . insulin aspart  0-24 Units Subcutaneous TID AC & HS  . insulin detemir  20 Units Subcutaneous Daily  . levothyroxine  50 mcg Oral QAC breakfast  . metolazone  2.5 mg Oral Daily  . metoprolol tartrate  12.5 mg Oral BID  . mupirocin ointment  1 application Nasal BID  . pantoprazole  40 mg Oral QAC breakfast  . potassium chloride  20 mEq Oral BID  . sodium chloride flush  3 mL Intravenous Q12H   Continuous Infusions: . sodium chloride     PRN Meds: sodium chloride, acetaminophen, bisacodyl **OR** bisacodyl, ondansetron **OR** ondansetron (ZOFRAN) IV, oxyCODONE, sodium chloride flush, traMADol   Vital Signs    Vitals:   05/21/18 0500 05/21/18 0600 05/21/18 0700 05/21/18 0800  BP: 113/63 115/61 (!) 96/55 119/69  Pulse: 78 80 76 91  Resp: (!) 21 (!) 23 19 (!) 37  Temp:    98.6 F (37 C)  TempSrc:    Oral  SpO2: 98% 95% 97% 93%  Weight: 279 lb 15.8 oz (127 kg)     Height:        Intake/Output Summary (Last 24 hours) at 05/21/2018 1153 Last data filed at 05/21/2018 1033 Gross per 24 hour  Intake 1903.25 ml  Output 1460 ml  Net 443.25 ml   Filed Weights   05/19/18 0615 05/20/18 0500 05/21/18 0500  Weight: 273 lb 12.8 oz (124.2 kg) 283 lb 8 oz (128.6 kg) 279 lb 15.8 oz (127 kg)    Telemetry    Period of atrial fibrillation this morning, now in sinus rhythm- Personally Reviewed   Physical Exam  Alert, oriented male in no distress GEN: No acute distress.     Neck: No JVD Cardiac: RRR, no murmurs Respiratory: Clear to auscultation bilaterally. GI: Soft, nontender, non-distended  MS:  1+ bilateral pretibial edema; No deformity. Neuro:  Nonfocal  Psych: Normal affect   Labs    Chemistry Recent Labs  Lab 05/19/18 0524  05/20/18 0416 05/20/18 1550 05/20/18 1556 05/21/18 0400  NA 139   < > 140  --  139 137  K 3.9   < > 3.9  --  4.0 3.7  CL 101   < > 105  --  100 102  CO2 27  --  26  --   --  28  GLUCOSE 143*   < > 118*  --  159* 174*  BUN 14   < > 9  --  9 8  CREATININE 1.17   < > 1.02 1.17 1.10 1.14  CALCIUM 8.9  --  7.6*  --   --  8.1*  GFRNONAA >60  --  >60 >60  --  >60  GFRAA >60  --  >60 >60  --  >60  ANIONGAP 11  --  9  --   --  7   < > = values  in this interval not displayed.     Hematology Recent Labs  Lab 05/20/18 0416 05/20/18 1550 05/20/18 1556 05/21/18 0400  WBC 9.8 10.5  --  10.7*  RBC 3.83* 3.82*  --  3.52*  HGB 11.1* 11.1* 10.9* 10.4*  HCT 34.5* 35.3* 32.0* 32.4*  MCV 90.1 92.4  --  92.0  MCH 29.0 29.1  --  29.5  MCHC 32.2 31.4  --  32.1  RDW 13.2 13.3  --  13.7  PLT 153 145*  --  128*    Cardiac Enzymes Recent Labs  Lab 05/18/18 0028 05/18/18 0524 05/18/18 1200  TROPONINI 0.04* 0.03* <0.03   No results for input(s): TROPIPOC in the last 168 hours.   BNPNo results for input(s): BNP, PROBNP in the last 168 hours.   DDimer No results for input(s): DDIMER in the last 168 hours.   Radiology    Dg Chest Port 1 View  Result Date: 05/21/2018 CLINICAL DATA:  CABG short of breath EXAM: PORTABLE CHEST 1 VIEW COMPARISON:  05/20/2018 FINDINGS: Swan-Ganz catheter removed. Right jugular sheath in place. Left chest tube removed. Mediastinal drain removed. Cardiac enlargement without edema. Mild bibasilar atelectasis. No pneumothorax IMPRESSION: Negative for pneumothorax post chest tube removal. Mild bibasilar atelectasis without edema. Electronically Signed   By: Marlan Palau M.D.   On: 05/21/2018 08:49    Dg Chest Port 1 View  Result Date: 05/20/2018 CLINICAL DATA:  Chest tube present.  CABG. EXAM: PORTABLE CHEST 1 VIEW COMPARISON:  05/19/2018. FINDINGS: ET tube removed. Low lung volumes post extubation. LEFT chest tube good position. Mediastinal tube good position. Swan-Ganz catheter tip main PA. Mild vascular congestion. IMPRESSION: Post extubation film.  Low lung volumes.  Vascular congestion. Electronically Signed   By: Elsie Stain M.D.   On: 05/20/2018 09:16   Dg Chest Port 1 View  Result Date: 05/19/2018 CLINICAL DATA:  Four-vessel CABG, history hypertension, diabetes mellitus, and STEMI EXAM: PORTABLE CHEST 1 VIEW COMPARISON:  Portable exam 1449 hours compared to 05/18/2018 FINDINGS: Tip of endotracheal tube projects 2.9 cm above carina. Nasogastric tube extends into stomach. RIGHT jugular Swan-Ganz catheter tip projects over proximal RIGHT pulmonary artery. Mediastinal drains and LEFT thoracostomy tube present. Enlargement of cardiac silhouette and prominence of mediastinum post CABG. Bibasilar atelectasis. No definite infiltrate, pleural effusion or pneumothorax. IMPRESSION: Postsurgical changes as above. Electronically Signed   By: Ulyses Southward M.D.   On: 05/19/2018 15:03    Cardiac Studies   Echo: Study Conclusions  - Procedure narrative: Transthoracic echocardiography. Image   quality was adequate. The study was technically difficult. - Left ventricle: The cavity size was normal. Wall thickness was   normal. Systolic function was normal. The estimated ejection   fraction was in the range of 55% to 60%. Although no diagnostic   regional wall motion abnormality was identified, this possibility   cannot be completely excluded on the basis of this study. Left   ventricular diastolic function parameters were normal. - Left atrium: The atrium was mildly dilated.  Patient Profile     57 y.o. male presenting with non-STEMI with a background of hypertension, diabetes, and obesity.   He is found to have severe multivessel coronary artery disease and is treated with CABG 05/19/2018 with a LIMA to LAD, saphenous vein graft OM, saphenous vein graft to PDA, and saphenous vein graft to PLA.  Assessment & Plan    1.  Non-STEMI: Minimal troponin elevation but found to have severe multivessel CAD with typical symptoms of  ACS.  Treated with multivessel CABG.  Medications reviewed and include aspirin 325 mg, a beta-blocker, and a high intensity statin drug.  Consider addition of clopidogrel 75 mg daily at discharge with reduction in ASA dose to 81 mg daily.  2.  Postoperative atrial fibrillation: Converted quickly to sinus rhythm on IV amiodarone, now transitioned to oral amiodarone.  Overall he is progressing well.  Management per cardiac surgery.  Will arrange outpatient cardiology follow-up in case he is discharged over the weekend.  Discussed follow-up plans with him and [place]   For questions or updates, please contact [facility] Please consult [email or web address] for contact info under Cardiology/STEMI.      Signed, Tonny BollmanMichael Jaymeson Mengel, MD  05/21/2018, 11:53 AM

## 2018-05-21 NOTE — Discharge Summary (Signed)
Physician Discharge Summary  Patient ID: Nicholas Chambers MRN: [id number] DOB/AGE: 02/17/1961 57 y.o.  Admit date: 05/17/2018 Discharge date: 05/23/2018  Admission Diagnoses: Non-STEMI  Discharge Diagnoses:  Principal Problem:   NSTEMI (non-ST elevated myocardial infarction) [facility]) Active Problems:   Essential hypertension   CHEST PAIN-UNSPECIFIED   S/P CABG x 4   Atrial fibrillation [facility])   Patient Active Problem List   Diagnosis Date Noted  . Atrial fibrillation ([place]) 05/21/2018  . 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 NSTEMI (non-ST elevated myocardial infarction) ([place]) 05/17/2018  . HYPOTHYROIDISM 03/01/2009  . HYPERLIPIDEMIA-MIXED 03/01/2009  . Essential hypertension 03/01/2009  . GERD 03/01/2009  . CHEST PAIN-UNSPECIFIED 03/01/2009    History of present illness: Patient is a 57 year old male with multiple comorbidities including hypertension, diabetes, obesity and hypothyroidism who presents with substernal chest pain.  He was doing well until approximately a week prior to admission when he felt the onset of substernal chest pain after about 20 to 30 minutes of exertion.  This was also associated with significant shortness of breath.  The symptoms were relieved with rest.  The episodes have increased in frequency and were beginning to occur with minimal exertion.  On the date of presentation the patient was having chest discomfort with activity such as getting dressed.  He denied associated diaphoresis, palpitations, nausea, vomiting, or syncope.  He has noted recently some trace edema over the past couple days in his lower extremities.  He presented to the emergency department in the evening of 05/17/2018.  She will troponin I was 0.04 and did not go higher.  EKG showed poor R wave progression but no acute ischemic changes.  He was transferred to [facility] for further evaluation and treatment to include cardiac catheterization.  Discharged Condition: good  Hospital Course: Patient was  transferred to [facility] for further evaluation and treatment.  He underwent cardiac catheterization on 05/18/2018.  He was found to have severe multivessel coronary artery disease and cardiothoracic surgical consultation was obtained with Susa Loffler, MD who evaluated the patient and studies.  He recommended proceeding with coronary artery surgical revascularization.  She was medically stabilized with heparin and initially nitroglycerin but this had to stop due to hypotension.  The surgery was scheduled and on 05/19/2018 he was taken the operating room where he underwent the below described procedure.  He tolerated well was taken to the surgical intensive care unit in stable condition.  Postoperative hospital course:  Patient is overall progressed well.  Initially did require some low-dose neo-but this was able to be weaned without difficulty.  The chest tubes were removed on postoperative day #1.  He did have some postoperative volume overload requiring diuresis.  He does have diabetes and his preop hemoglobin A1c was 7.9.  He initially had a Glucomander protocol and was started on insulin.  It is still to be determined if he will need this at discharge but he has been started on his home Amaryl.  He will require aggressive lifestyle and nutrition management long-term.  He has had postoperative atrial fibrillation which responded to intravenous amiodarone and he is being transition to oral.  He has a mild expected acute blood loss anemia and most recent hemoglobin and hematocrit are 11.4/35.6 .  Renal function is within normal limits and most recent BUN and creatinine are 10 and 1.12.  All routine lines, monitors and drainage devices have been discontinued in standard fashion.  Incisions are noted to be healing well without any evidence of  infection.  He is tolerating gradually increasing activity using cardiac rehab modalities.  Alba Cory has been weaned and he maintains good saturations on room air using  standard pulmonary toilet and incentive spirometry measures.  At the time of discharge the patient is felt to be quite stable.  Consults: cardiology  Significant Diagnostic Studies: angiography: Cardiac catheterization  Treatments: surgery:                                                                        CARDIOVASCULAR SURGERY OPERATIVE NOTE  05/19/2018  Surgeon:  Gaye Pollack, MD  First Assistant: Ellwood Handler,  PA-C   Preoperative Diagnosis:  Severe multi-vessel coronary artery disease   Postoperative Diagnosis:  Same   Procedure:  1. Median Sternotomy 2. Extracorporeal circulation 3.   Coronary artery bypass grafting x 4   Left internal mammary graft to the LAD  SVG to OM  SVG to PDA  SVG to PL 4.   Endoscopic vein harvest from the right leg   Anesthesia:  General Endotracheal    Discharge Exam: Blood pressure 109/62, pulse 80, temperature 98.7 F (37.1 C), temperature source Oral, resp. rate 15, height 5' 9"  (1.753 m), weight 124.2 kg (273 lb 13 oz), SpO2 94 %.   General appearance: alert, cooperative and no distress Heart: regular rate and rhythm, S1, S2 normal, no murmur, click, rub or gallop Lungs: clear to auscultation bilaterally Abdomen: soft, non-tender; bowel sounds normal; no masses,  no organomegaly Extremities: extremities normal, atraumatic, no cyanosis or edema Wound: clean and dry      Disposition: Discharge disposition: 01-Home or Self Care     Discharged home  Discharge Instructions    Amb Referral to Cardiac Rehabilitation   Complete by:  As directed    Diagnosis:   NSTEMI CABG     CABG X ___:  4     Allergies as of 05/23/2018   No Known Allergies     Medication List    STOP taking these medications   lisinopril 10 MG tablet Commonly known as:  PRINIVIL,ZESTRIL   LIVALO 2 MG Tabs Generic drug:  Pitavastatin Calcium   naproxen sodium 220 MG tablet Commonly known as:  ALEVE   sildenafil  50 MG tablet Commonly known as:  VIAGRA     TAKE these medications   acetaminophen 325 MG tablet Commonly known as:  TYLENOL Take 2 tablets (650 mg total) by mouth every 6 (six) hours as needed for mild pain.   amiodarone 200 MG tablet Commonly known as:  PACERONE Take 2 tabs (41m) twice a day for 3 days then take 1 tab (2072m twice a day until we see you in follow-up.   aspirin 325 MG EC tablet Take 1 tablet (325 mg total) by mouth daily. What changed:    medication strength  how much to take  when to take this   atorvastatin 80 MG tablet Commonly known as:  LIPITOR Take 1 tablet (80 mg total) by mouth daily at 6 PM.   Cyanocobalamin 1000 MCG/ML Kit Inject 1 mL as directed every 30 (thirty) days.   esomeprazole 40 MG capsule Commonly known as:  NEXIUM Take 40 mg by mouth daily before breakfast.   glimepiride 2  MG tablet Commonly known as:  AMARYL Take 2 mg by mouth daily with breakfast.   metFORMIN 500 MG tablet Commonly known as:  GLUCOPHAGE Take 1 tablet (500 mg total) by mouth 2 (two) times daily with a meal.   metoprolol tartrate 25 MG tablet Commonly known as:  LOPRESSOR Take 0.5 tablets (12.5 mg total) by mouth 2 (two) times daily.   oxyCODONE 5 MG immediate release tablet Commonly known as:  Oxy IR/ROXICODONE Take 1 tablet (5 mg total) by mouth every 6 (six) hours as needed for severe pain.   ranitidine 300 MG tablet Commonly known as:  ZANTAC Take 300 mg by mouth at bedtime.   SYNTHROID 50 MCG tablet Generic drug:  levothyroxine Take 50 mcg by mouth daily before breakfast.      Follow-up Information    Belva Crome, MD Follow up.   Specialty:  Cardiology Why:  Please see discharge paperwork for 2-week follow-up appointment with cardiology. Contact information: [address] [place] [address] [phone]        Gaye Pollack, MD Follow up.   Specialty:  Cardiothoracic Surgery Why:  Please see discharge  paperwork for follow-up appointment Dr. Cyndia Bent as well as the nurse for suture removal.  On the date he see Dr. Cyndia Bent obtain a chest x-ray at [facility] imaging 1/2-hour prior to the appointment located in the same office complex. Contact information: [address] [place] [address] [phone]          The patient has been discharged on:   1.Beta Blocker:  Yes Blue.Reese   ]                              No   [   ]                              If No, reason:  2.Ace Inhibitor/ARB: Yes [   ]                                     No  [  n  ]                                     If No, reason: titration of BB  3.Statin:   Yes [  y ]                  No  [   ]                  If No, reason:  4.Ecasa:  Yes  [ y  ]                  No   [   ]                  If No, reason:  Signed: Elgie Collard 05/23/2018, 8:36 AM

## 2018-05-22 LAB — GLUCOSE, CAPILLARY
GLUCOSE-CAPILLARY: 168 mg/dL — AB (ref 70–99)
Glucose-Capillary: 149 mg/dL — ABNORMAL HIGH (ref 70–99)
Glucose-Capillary: 152 mg/dL — ABNORMAL HIGH (ref 70–99)
Glucose-Capillary: 166 mg/dL — ABNORMAL HIGH (ref 70–99)

## 2018-05-22 LAB — CBC
HEMATOCRIT: 35.6 % — AB (ref 39.0–52.0)
HEMOGLOBIN: 11.4 g/dL — AB (ref 13.0–17.0)
MCH: 29.4 pg (ref 26.0–34.0)
MCHC: 32 g/dL (ref 30.0–36.0)
MCV: 91.8 fL (ref 78.0–100.0)
Platelets: 162 10*3/uL (ref 150–400)
RBC: 3.88 MIL/uL — ABNORMAL LOW (ref 4.22–5.81)
RDW: 13.5 % (ref 11.5–15.5)
WBC: 12.4 10*3/uL — ABNORMAL HIGH (ref 4.0–10.5)

## 2018-05-22 LAB — BASIC METABOLIC PANEL
ANION GAP: 12 (ref 5–15)
BUN: 10 mg/dL (ref 6–20)
CO2: 28 mmol/L (ref 22–32)
Calcium: 8.7 mg/dL — ABNORMAL LOW (ref 8.9–10.3)
Chloride: 94 mmol/L — ABNORMAL LOW (ref 98–111)
Creatinine, Ser: 1.12 mg/dL (ref 0.61–1.24)
GFR calc Af Amer: >60 mL/min (ref >60)
GFR calc non Af Amer: >60 mL/min (ref >60)
GLUCOSE: 168 mg/dL — AB (ref 70–99)
POTASSIUM: 3.5 mmol/L (ref 3.5–5.1)
Sodium: 134 mmol/L — ABNORMAL LOW (ref 135–145)

## 2018-05-22 MED ORDER — METFORMIN HCL 500 MG PO TABS
500.0000 mg | ORAL_TABLET | Freq: Two times a day (BID) | ORAL | Status: DC
  Administered 2018-05-22 – 2018-05-23 (×2): 500 mg via ORAL
  Filled 2018-05-22 (×2): qty 1

## 2018-05-22 MED ORDER — INSULIN DETEMIR 100 UNIT/ML ~~LOC~~ SOLN
10.0000 [IU] | Freq: Every day | SUBCUTANEOUS | Status: AC
  Administered 2018-05-22: 10 [IU] via SUBCUTANEOUS
  Filled 2018-05-22: qty 0.1

## 2018-05-22 NOTE — Progress Notes (Signed)
Pacer wires pulled at 1347.  Wires removed without difficulty.  Patient tolerated well.  Bedrest for one hour.

## 2018-05-22 NOTE — Plan of Care (Signed)
Care plans reviewed and patient is progressing.  

## 2018-05-22 NOTE — Progress Notes (Addendum)
      [address]       [place] [id number]             [phone]      3 Days Post-Op Procedure(s) (LRB): CORONARY ARTERY BYPASS GRAFTING (CABG) x 4 WITH ENDOSCOPIC HARVESTING OF RIGHT SAPHENOUS VEIN (N/A) TRANSESOPHAGEAL ECHOCARDIOGRAM (TEE) (N/A) Subjective: Feels okay this morning. His appetite is coming back. He is looking forward to walking in the halls.   Objective: Vital signs in last 24 hours: Temp:  [98.2 F (36.8 C)-99.9 F (37.7 C)] 98.2 F (36.8 C) (07/13 0800) Pulse Rate:  [75-95] 75 (07/13 0800) Cardiac Rhythm: Normal sinus rhythm (07/13 0700) Resp:  [19-25] 19 (07/13 0800) BP: (97-123)/(61-76) 121/75 (07/13 0800) SpO2:  [91 %-100 %] 100 % (07/13 0800) Weight:  [126.3 kg (278 lb 7.1 oz)] 126.3 kg (278 lb 7.1 oz) (07/13 0425)     Intake/Output from previous day: 07/12 0701 - 07/13 0700 In: 598.9 [P.O.:465; I.V.:70.5; IV Piggyback:63.4] Out: 900 [Urine:900] Intake/Output this shift: No intake/output data recorded.  General appearance: alert, cooperative and no distress Heart: regular rate and rhythm, S1, S2 normal, no murmur, click, rub or gallop Lungs: clear to auscultation bilaterally Abdomen: soft, non-tender; bowel sounds normal; no masses,  no organomegaly Extremities: 1+ non pitting edema in lower extremity Wound: clean and dry. Small amount of bloody drainage in the center of the incision when I removed the dressing  Lab Results: Recent Labs    05/21/18 0400 05/22/18 0349  WBC 10.7* 12.4*  HGB 10.4* 11.4*  HCT 32.4* 35.6*  PLT 128* 162   BMET:  Recent Labs    05/21/18 0400 05/22/18 0349  NA 137 134*  K 3.7 3.5  CL 102 94*  CO2 28 28  GLUCOSE 174* 168*  BUN 8 10  CREATININE 1.14 1.12  CALCIUM 8.1* 8.7*    PT/INR:  Recent Labs    05/19/18 1448  LABPROT 15.9*  INR 1.28   ABG    Component Value Date/Time   PHART 7.343 (L) 05/19/2018 2024   HCO3 25.9 05/19/2018 2024   TCO2 26 05/20/2018 1556   ACIDBASEDEF  1.0 05/19/2018 1851   O2SAT 98.0 05/19/2018 2024   CBG (last 3)  Recent Labs    05/21/18 1552 05/21/18 2018 05/22/18 0554  GLUCAP 164* 134* 149*    Assessment/Plan: S/P Procedure(s) (LRB): CORONARY ARTERY BYPASS GRAFTING (CABG) x 4 WITH ENDOSCOPIC HARVESTING OF RIGHT SAPHENOUS VEIN (N/A) TRANSESOPHAGEAL ECHOCARDIOGRAM (TEE) (N/A)  1. CV-NSR in the 70s. Afib yesterday but converted with Amio drip. Now tolerating Amio PO 400mg  BID, ASA, Lipitor, and low-dose lopressor. BP well controlled. Hold off on starting ACEI. 2. Pulm-tolerating room air with great oxygen saturations. CXR yesterday negative for pneumothorax, mild atelectasis. 3. Renal-creatinine 1.12, on metolazone and Lasix for fluid overload. He is on potassium supplementation.  4. H and H is stable at 11.4/35.6, platelets 162k 5. Endo-blood glucose level well controlled. Last dose of Levemir this morning (reduced to 10 units) and start Metformin tonight. Continue Amaryl and SSI. A1C is 7.9  Plan: Continue to monitor rhythm closely. Continue diuretics for fluid overload. If remains in NSR possibly pull pacing wires tomorrow and home Monday.   LOS: 4 days    Sharlene Doryessa N Conte 05/22/2018  I have seen and examined Nicholas MostGregory D Chambers and agree with the above assessment  and plan.  Delight OvensEdward B Jencarlo Bonadonna MD [facility] [phone] Office [phone] 05/22/2018 11:21 AM

## 2018-05-22 NOTE — Progress Notes (Signed)
CARDIAC REHAB PHASE I   PRE:  Rate/Rhythm: 80 SR   BP:  Sitting: 121/73      SaO2: 93% RA  MODE:  Ambulation: 320 ft   POST:  Rate/Rhythm: 98 SR   BP:  Sitting: 130/68      SaO2: 93% RA   Pt eager to go for a walk. Pt ambulated 320 ft independently, with steady gait. Pt returned to chair, with call bell within reach. Pt denied any complaints of CP, SOB or dizziness. Reviewed sternal precautions with pt and handout given. Pt demonstrated good sternal precaution. Spoke with pt regarding Cardiac Rehab. Referral will be sent to [facility] per pt's request. Gave pt [facility] brochure, just to give pt a better understand of what the program could look like. Pt will share brochure with wife.   [phone]  York Ceriseyara R Noble Bodie MS, ACSM CEP  11:29 AM 05/22/2018

## 2018-05-23 LAB — GLUCOSE, CAPILLARY: Glucose-Capillary: 122 mg/dL — ABNORMAL HIGH (ref 70–99)

## 2018-05-23 MED ORDER — METOPROLOL TARTRATE 25 MG PO TABS: 12.5000 mg | ORAL_TABLET | Freq: Two times a day (BID) | ORAL | 1 refills | Status: DC

## 2018-05-23 MED ORDER — ASPIRIN 325 MG PO TBEC: 325.0000 mg | DELAYED_RELEASE_TABLET | Freq: Every day | ORAL | 0 refills | Status: DC

## 2018-05-23 MED ORDER — METFORMIN HCL 500 MG PO TABS: 500.0000 mg | ORAL_TABLET | Freq: Two times a day (BID) | ORAL | 1 refills | Status: DC

## 2018-05-23 MED ORDER — ACETAMINOPHEN 325 MG PO TABS: 650.0000 mg | ORAL_TABLET | Freq: Four times a day (QID) | ORAL | Status: DC | PRN

## 2018-05-23 MED ORDER — OXYCODONE HCL 5 MG PO TABS: 5.0000 mg | ORAL_TABLET | Freq: Four times a day (QID) | ORAL | 0 refills | Status: DC | PRN

## 2018-05-23 MED ORDER — ATORVASTATIN CALCIUM 80 MG PO TABS: 80.0000 mg | ORAL_TABLET | Freq: Every day | ORAL | 1 refills | Status: DC

## 2018-05-23 MED ORDER — AMIODARONE HCL 200 MG PO TABS: ORAL_TABLET | ORAL | 1 refills | Status: DC

## 2018-05-23 NOTE — Progress Notes (Signed)
Mr and Mrs Nicholas Chambers were given discharge instructions.  Discussed new medications, stopped medications and prescriptions.  Discussed follow up appointments and signs and symptoms to contact the physician for.  Verbalized understanding.

## 2018-05-23 NOTE — Plan of Care (Signed)
Care plans reviewed and patient is progressing.  

## 2018-05-23 NOTE — Progress Notes (Signed)
      [address]       [place] [id number]             [phone]      4 Days Post-Op Procedure(s) (LRB): CORONARY ARTERY BYPASS GRAFTING (CABG) x 4 WITH ENDOSCOPIC HARVESTING OF RIGHT SAPHENOUS VEIN (N/A) TRANSESOPHAGEAL ECHOCARDIOGRAM (TEE) (N/A) Subjective: Feels great today. No issues overnight.   Objective: Vital signs in last 24 hours: Temp:  [98.7 F (37.1 C)-98.9 F (37.2 C)] 98.7 F (37.1 C) (07/14 0418) Pulse Rate:  [77-80] 80 (07/14 0418) Cardiac Rhythm: Normal sinus rhythm (07/14 0700) Resp:  [15-21] 15 (07/14 0418) BP: (109-116)/(62-73) 109/62 (07/14 0418) SpO2:  [94 %-99 %] 94 % (07/14 0418) Weight:  [124.2 kg (273 lb 13 oz)] 124.2 kg (273 lb 13 oz) (07/14 0418)     Intake/Output from previous day: 07/13 0701 - 07/14 0700 In: 480 [P.O.:480] Out: 550 [Urine:550] Intake/Output this shift: No intake/output data recorded.  General appearance: alert, cooperative and no distress Heart: regular rate and rhythm, S1, S2 normal, no murmur, click, rub or gallop Lungs: clear to auscultation bilaterally Abdomen: soft, non-tender; bowel sounds normal; no masses,  no organomegaly Extremities: extremities normal, atraumatic, no cyanosis or edema Wound: clean and dry  Lab Results: Recent Labs    05/21/18 0400 05/22/18 0349  WBC 10.7* 12.4*  HGB 10.4* 11.4*  HCT 32.4* 35.6*  PLT 128* 162   BMET:  Recent Labs    05/21/18 0400 05/22/18 0349  NA 137 134*  K 3.7 3.5  CL 102 94*  CO2 28 28  GLUCOSE 174* 168*  BUN 8 10  CREATININE 1.14 1.12  CALCIUM 8.1* 8.7*    PT/INR: No results for input(s): LABPROT, INR in the last 72 hours. ABG    Component Value Date/Time   PHART 7.343 (L) 05/19/2018 2024   HCO3 25.9 05/19/2018 2024   TCO2 26 05/20/2018 1556   ACIDBASEDEF 1.0 05/19/2018 1851   O2SAT 98.0 05/19/2018 2024   CBG (last 3)  Recent Labs    05/22/18 1623 05/22/18 2046 05/23/18 0607  GLUCAP 168* 166* 122*     Assessment/Plan: S/P Procedure(s) (LRB): CORONARY ARTERY BYPASS GRAFTING (CABG) x 4 WITH ENDOSCOPIC HARVESTING OF RIGHT SAPHENOUS VEIN (N/A) TRANSESOPHAGEAL ECHOCARDIOGRAM (TEE) (N/A)  1. CV-NSR in the 70s. Afib on 7/12 but converted with Amio drip. Now tolerating Amio PO 400mg  BID, ASA, Lipitor, and low-dose lopressor. BP well controlled. Hold off on starting ACEI. 2. Pulm-tolerating room air with great oxygen saturations.Last CXR stable. 3. Renal-creatinine 1.12, on metolazone and Lasix for fluid overload. He is on potassium supplementation. He is now down to his baseline weight.   4. H and H is stable at 11.4/35.6, platelets 162k 5. Endo-blood glucose level well controlled. Metformin and Amaryl and SSI. A1C is 7.9.   Plan: Discharge today. Instructions reviewed with the patient. I don't think he will need diuretics since he is down to his baseline weight. Will need close follow-up outpatient for diabetes control.    LOS: 5 days    Sharlene Doryessa N Oliviya Gilkison 05/23/2018

## 2018-05-23 NOTE — Plan of Care (Signed)
Patient is adequate for discharge.  

## 2018-05-25 ENCOUNTER — Other Ambulatory Visit: Payer: Self-pay | Admitting: *Deleted

## 2018-05-25 DIAGNOSIS — R110 Nausea: Secondary | ICD-10-CM

## 2018-05-25 MED ORDER — ONDANSETRON HCL 4 MG PO TABS: 4.0000 mg | ORAL_TABLET | Freq: Three times a day (TID) | ORAL | 0 refills | Status: DC | PRN

## 2018-05-27 DIAGNOSIS — Z1331 Encounter for screening for depression: Secondary | ICD-10-CM | POA: Diagnosis not present

## 2018-05-27 DIAGNOSIS — E1165 Type 2 diabetes mellitus with hyperglycemia: Secondary | ICD-10-CM | POA: Diagnosis not present

## 2018-05-27 DIAGNOSIS — Z1339 Encounter for screening examination for other mental health and behavioral disorders: Secondary | ICD-10-CM | POA: Diagnosis not present

## 2018-05-27 DIAGNOSIS — I2510 Atherosclerotic heart disease of native coronary artery without angina pectoris: Secondary | ICD-10-CM | POA: Diagnosis not present

## 2018-05-28 ENCOUNTER — Ambulatory Visit (INDEPENDENT_AMBULATORY_CARE_PROVIDER_SITE_OTHER): Payer: Self-pay

## 2018-05-28 DIAGNOSIS — Z4802 Encounter for removal of sutures: Secondary | ICD-10-CM

## 2018-05-28 NOTE — Progress Notes (Signed)
Removed 3 sutures from chest tube incision sites with no signs of infection and patient tolerated well.  

## 2018-06-03 DIAGNOSIS — I2510 Atherosclerotic heart disease of native coronary artery without angina pectoris: Secondary | ICD-10-CM | POA: Insufficient documentation

## 2018-06-03 HISTORY — DX: Atherosclerotic heart disease of native coronary artery without angina pectoris: I25.10

## 2018-06-04 DIAGNOSIS — E1165 Type 2 diabetes mellitus with hyperglycemia: Secondary | ICD-10-CM | POA: Diagnosis not present

## 2018-06-04 DIAGNOSIS — Z6839 Body mass index (BMI) 39.0-39.9, adult: Secondary | ICD-10-CM | POA: Diagnosis not present

## 2018-06-04 DIAGNOSIS — R0609 Other forms of dyspnea: Secondary | ICD-10-CM | POA: Diagnosis not present

## 2018-06-04 DIAGNOSIS — I10 Essential (primary) hypertension: Secondary | ICD-10-CM | POA: Diagnosis not present

## 2018-06-08 ENCOUNTER — Ambulatory Visit: Payer: BLUE CROSS/BLUE SHIELD | Admitting: Cardiology

## 2018-06-08 ENCOUNTER — Encounter: Payer: Self-pay | Admitting: Cardiology

## 2018-06-08 VITALS — BP 128/66 | HR 80 | Ht 69.0 in | Wt 255.0 lb

## 2018-06-08 DIAGNOSIS — I480 Paroxysmal atrial fibrillation: Secondary | ICD-10-CM

## 2018-06-08 DIAGNOSIS — I2510 Atherosclerotic heart disease of native coronary artery without angina pectoris: Secondary | ICD-10-CM

## 2018-06-08 DIAGNOSIS — R202 Paresthesia of skin: Secondary | ICD-10-CM

## 2018-06-08 DIAGNOSIS — I10 Essential (primary) hypertension: Secondary | ICD-10-CM

## 2018-06-08 DIAGNOSIS — Z951 Presence of aortocoronary bypass graft: Secondary | ICD-10-CM

## 2018-06-08 NOTE — Progress Notes (Signed)
Cardiology Office Note:    Date:  06/08/2018   ID:  Nicholas Chambers, DOB [birth year], MRN [id number]  PCP:  Angelina Sheriff, MD  Cardiologist:  Jenne Campus, MD    Referring MD: Angelina Sheriff, MD   No chief complaint on file. Doing well but had some dizzy spells  History of Present Illness:    Nicholas Chambers is a 57 y.o. male with diabetes as well as hypertension recently he underwent quadruple bypass surgery LIMA to LAD, SVG to obtuse marginal SVG to PDA SVG to PL since that time he is doing well however he did have 2 episodes when he became dizzy at one time he was walking became more very dizzy he did not fall down did not passed out second time when he was sitting similar sensation happen he did not feel any palpitations while he was in the hospital he had episode of proximal mitral fibrillation he felt is currently he is on amiodarone which I will continue for now.  Denies having any chest pain tightness squeezing pressure burning chest he said he feels dramatically better retrospectively he tells me to 4 months he has been experiencing horrible heartburn at night now heartburn is gone.  Past Medical History:  Diagnosis Date  . Atrial fibrillation ([place]) 05/21/2018   Postoperative following CABG  . CAD (coronary artery disease) 06/03/2018  . CHEST PAIN-UNSPECIFIED 03/01/2009   Qualifier: Diagnosis of  By: Mare Ferrari, RMA, Sherri    . Diabetes mellitus ([place])   . Essential hypertension 03/01/2009   Qualifier: Diagnosis of  By: Mare Ferrari, [place], Sherri    . GERD 03/01/2009   Qualifier: Diagnosis of  By: Mare Ferrari, RMA, Sherri    . HYPERLIPIDEMIA-MIXED 03/01/2009   Qualifier: Diagnosis of  By: Mare Ferrari, RMA, Sherri    . Hypertension   . Hyperthyroidism   . HYPOTHYROIDISM 03/01/2009   Qualifier: Diagnosis of  By: Mare Ferrari, RMA, Sherri    . NSTEMI (non-ST elevated myocardial infarction) ([place]) 05/17/2018  . 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 Tingling in extremities 06/09/2014    Past  Surgical History:  Procedure Laterality Date  . CORONARY ARTERY BYPASS GRAFT N/A 05/19/2018   Procedure: CORONARY ARTERY BYPASS GRAFTING (CABG) x 4 WITH ENDOSCOPIC HARVESTING OF RIGHT SAPHENOUS VEIN;  Surgeon: Gaye Pollack, MD;  Location: [facility];  Service: Open Heart Surgery;  Laterality: N/A;  . LEFT HEART CATH AND CORONARY ANGIOGRAPHY N/A 05/18/2018   Procedure: LEFT HEART CATH AND CORONARY ANGIOGRAPHY;  Surgeon: Belva Crome, MD;  Location: [facility];  Service: Cardiovascular;  Laterality: N/A;  . SHOULDER SURGERY     one on R, two on L  . TEE WITHOUT CARDIOVERSION N/A 05/19/2018   Procedure: TRANSESOPHAGEAL ECHOCARDIOGRAM (TEE);  Surgeon: Gaye Pollack, MD;  Location: [facility];  Service: Open Heart Surgery;  Laterality: N/A;  . UMBILICAL HERNIA REPAIR      Current Medications: Current Meds  Medication Sig  . acetaminophen (TYLENOL) 325 MG tablet Take 2 tablets (650 mg total) by mouth every 6 (six) hours as needed for mild pain.  [name] amiodarone (PACERONE) 200 MG tablet Take 2 tabs (435m) twice a day for 3 days then take 1 tab (2055m twice a day until we see you in follow-up.  . Marland Kitchenspirin EC 325 MG EC tablet Take 1 tablet (325 mg total) by mouth daily.  . Marland Kitchentorvastatin (LIPITOR) 80 MG tablet Take 1 tablet (80 mg total) by mouth daily at 6 PM.  .  Cyanocobalamin 1000 MCG/ML KIT Inject 1 mL as directed every 30 (thirty) days.  . empagliflozin (JARDIANCE) 25 MG TABS tablet Take 25 mg by mouth daily.  [name] esomeprazole (NEXIUM) 40 MG capsule Take 40 mg by mouth daily before breakfast.  . levothyroxine (SYNTHROID) 50 MCG tablet Take 50 mcg by mouth daily before breakfast.  . metoprolol tartrate (LOPRESSOR) 25 MG tablet Take 0.5 tablets (12.5 mg total) by mouth 2 (two) times daily.  . ondansetron (ZOFRAN) 4 MG tablet Take 1 tablet (4 mg total) by mouth every 8 (eight) hours as needed for nausea or vomiting.  . ranitidine (ZANTAC) 300 MG tablet Take 300 mg by mouth at bedtime.       Allergies:   Crestor [rosuvastatin calcium]; Lipitor [atorvastatin calcium]; Metformin and related; Niacin and related; and Viibryd [vilazodone hcl]   Social History   Socioeconomic History  . Marital status: Married    Spouse name: Not on file  . Number of children: Not on file  . Years of education: Not on file  . Highest education level: Not on file  Occupational History  . Not on file  Social Needs  . Financial resource strain: Not on file  . Food insecurity:    Worry: Not on file    Inability: Not on file  . Transportation needs:    Medical: Not on file    Non-medical: Not on file  Tobacco Use  . Smoking status: Never Smoker  . Smokeless tobacco: Never Used  Substance and Sexual Activity  . Alcohol use: Never    Frequency: Never  . Drug use: Never  . Sexual activity: Not Currently  Lifestyle  . Physical activity:    Days per week: Not on file    Minutes per session: Not on file  . Stress: Not on file  Relationships  . Social connections:    Talks on phone: Not on file    Gets together: Not on file    Attends religious service: Not on file    Active member of club or organization: Not on file    Attends meetings of clubs or organizations: Not on file    Relationship status: Not on file  Other Topics Concern  . Not on file  Social History Narrative  . Not on file     Family History: The patient's family history includes Diabetes in his brother. ROS:   Please see the history of present illness.    All 14 point review of systems negative except as described per history of present illness  EKGs/Labs/Other Studies Reviewed:      Recent Labs: 05/19/2018: TSH 9.385 05/20/2018: Magnesium 2.2 05/22/2018: BUN 10; Creatinine, Ser 1.12; Hemoglobin 11.4; Platelets 162; Potassium 3.5; Sodium 134  Recent Lipid Panel    Component Value Date/Time   CHOL 240 (H) 05/18/2018 0028   TRIG 296 (H) 05/18/2018 0028   HDL 40 (L) 05/18/2018 0028   CHOLHDL 6.0 05/18/2018  0028   VLDL 59 (H) 05/18/2018 0028   LDLCALC 141 (H) 05/18/2018 0028    Physical Exam:    VS:  BP 128/66 (BP Location: Right Arm, Patient Position: Sitting, Cuff Size: Normal)   Pulse 80   Ht 5' 9"  (1.753 m)   Wt 255 lb (115.7 kg)   SpO2 98%   BMI 37.66 kg/m     Wt Readings from Last 3 Encounters:  06/08/18 255 lb (115.7 kg)  05/23/18 273 lb 13 oz (124.2 kg)     GEN:  Well  nourished, well developed in no acute distress HEENT: Normal NECK: No JVD; No carotid bruits LYMPHATICS: No lymphadenopathy CARDIAC: RRR, no murmurs, no rubs, no gallops RESPIRATORY:  Clear to auscultation without rales, wheezing or rhonchi  ABDOMEN: Soft, non-tender, non-distended MUSCULOSKELETAL:  No edema; No deformity  SKIN: Warm and dry LOWER EXTREMITIES: no swelling NEUROLOGIC:  Alert and oriented x 3 PSYCHIATRIC:  Normal affect   ASSESSMENT:    1. Paroxysmal atrial fibrillation (HCC)   2. Coronary artery disease involving native coronary artery of native heart without angina pectoris   3. Essential hypertension   4. Tingling in extremities   5. S/P CABG x 4    PLAN:    In order of problems listed above:  1. Coronary artery disease status post coronary artery bypass graft we described the procedure we describe what was done and why it was done he understand he was very knowledgeable about his condition as well as about the medication.  He knew every single medication the purpose of it.  We did talk about healthy lifestyle he will be involved to rehab program. 2. Dizziness I would make the very concerned ask him to wear event recorder for a month.  Will also do EKG today. 3. Dyslipidemia he is on high intensity statin which I will continue.  In about 4 weeks we will repeat his a fasting lipid profile. 4. Diabetes mellitus: Followed by internal medicine team.  We will try to get his hemoglobin A1c less than 7.  Overall he is recovering very nicely after coronary artery bypass graft doing well  is very intelligent and knowledgeable about his condition.  His prognosis should be good.  Concern obviously is about his dizzy spells.   Medication Adjustments/Labs and Tests Ordered: Current medicines are reviewed at length with the patient today.  Concerns regarding medicines are outlined above.  No orders of the defined types were placed in this encounter.  Medication changes: No orders of the defined types were placed in this encounter.   Signed, Park Liter, MD, [facility] 06/08/2018 3:37 PM    [facility]

## 2018-06-08 NOTE — Patient Instructions (Signed)
Medication Instructions:  Your physician recommends that you continue on your current medications as directed. Please refer to the Current Medication list given to you today.  Labwork: None  Testing/Procedures: Your physician has recommended that you wear an event monitor. Event monitors are medical devices that record the heart's electrical activity. Doctors most often us these monitors to diagnose arrhythmias. Arrhythmias are problems with the speed or rhythm of the heartbeat. The monitor is a small, portable device. You can wear one while you do your normal daily activities. This is usually used to diagnose what is causing palpitations/syncope (passing out).  Follow-Up: Your physician recommends that you schedule a follow-up appointment in: 1 month  Any Other Special Instructions Will Be Listed Below (If Applicable).  Referral to cardiac rehab has been made, they should contact you with an appointment date and time.   If you need a refill on your cardiac medications before your next appointment, please call your pharmacy.   [facility]  Garey HamAshley A, RN, BSN

## 2018-06-10 ENCOUNTER — Ambulatory Visit: Payer: BLUE CROSS/BLUE SHIELD | Admitting: Cardiology

## 2018-06-16 ENCOUNTER — Other Ambulatory Visit: Payer: Self-pay | Admitting: Surgery

## 2018-06-16 DIAGNOSIS — Z951 Presence of aortocoronary bypass graft: Secondary | ICD-10-CM

## 2018-06-17 DIAGNOSIS — Z6837 Body mass index (BMI) 37.0-37.9, adult: Secondary | ICD-10-CM | POA: Diagnosis not present

## 2018-06-17 DIAGNOSIS — E785 Hyperlipidemia, unspecified: Secondary | ICD-10-CM | POA: Diagnosis not present

## 2018-06-17 DIAGNOSIS — E1165 Type 2 diabetes mellitus with hyperglycemia: Secondary | ICD-10-CM | POA: Diagnosis not present

## 2018-06-17 DIAGNOSIS — Z23 Encounter for immunization: Secondary | ICD-10-CM | POA: Diagnosis not present

## 2018-06-17 DIAGNOSIS — I10 Essential (primary) hypertension: Secondary | ICD-10-CM | POA: Diagnosis not present

## 2018-06-21 ENCOUNTER — Ambulatory Visit
Admission: RE | Admit: 2018-06-21 | Discharge: 2018-06-21 | Disposition: A | Discharge status: Home / Self Care | Payer: BLUE CROSS/BLUE SHIELD | Source: Ambulatory Visit | Attending: Surgery | Admitting: Surgery

## 2018-06-21 ENCOUNTER — Ambulatory Visit (INDEPENDENT_AMBULATORY_CARE_PROVIDER_SITE_OTHER): Payer: Self-pay | Admitting: Physician Assistant

## 2018-06-21 ENCOUNTER — Other Ambulatory Visit: Payer: Self-pay

## 2018-06-21 VITALS — BP 127/84 | HR 70 | Resp 18 | Ht 69.0 in | Wt 252.2 lb

## 2018-06-21 DIAGNOSIS — Z79899 Other long term (current) drug therapy: Secondary | ICD-10-CM | POA: Diagnosis not present

## 2018-06-21 DIAGNOSIS — J90 Pleural effusion, not elsewhere classified: Secondary | ICD-10-CM | POA: Diagnosis not present

## 2018-06-21 DIAGNOSIS — Z951 Presence of aortocoronary bypass graft: Secondary | ICD-10-CM

## 2018-06-21 DIAGNOSIS — J9811 Atelectasis: Secondary | ICD-10-CM | POA: Diagnosis not present

## 2018-06-21 DIAGNOSIS — E1165 Type 2 diabetes mellitus with hyperglycemia: Secondary | ICD-10-CM | POA: Diagnosis not present

## 2018-06-21 DIAGNOSIS — M109 Gout, unspecified: Secondary | ICD-10-CM | POA: Diagnosis not present

## 2018-06-21 DIAGNOSIS — I25118 Atherosclerotic heart disease of native coronary artery with other forms of angina pectoris: Secondary | ICD-10-CM

## 2018-06-21 DIAGNOSIS — E785 Hyperlipidemia, unspecified: Secondary | ICD-10-CM | POA: Diagnosis not present

## 2018-06-21 DIAGNOSIS — E039 Hypothyroidism, unspecified: Secondary | ICD-10-CM | POA: Diagnosis not present

## 2018-06-21 NOTE — Patient Instructions (Signed)
You may return to driving an automobile as long as you are no longer requiring oral narcotic pain relievers during the daytime.  It would be wise to start driving only short distances during the daylight and gradually increase from there as you feel comfortable.  Make every effort to stay physically active, get some type of exercise on a regular basis, and stick to a "heart healthy diet".  The long term benefits for regular exercise and a healthy diet are critically important to your overall health and wellbeing.  You are encouraged to enroll and participate in the outpatient cardiac rehab program beginning as soon as practical.   

## 2018-06-21 NOTE — Progress Notes (Signed)
Nicholas Chambers is a 57 y.o. male patient who underwent a coronary bypass grafting x4 with Dr. Cyndia Bent on 05/19/2018.  The only complication while in the hospital was postoperative atrial fibrillation.  No diagnosis found. Past Medical History:  Diagnosis Date  . Atrial fibrillation ([place]) 05/21/2018   Postoperative following CABG  . CAD (coronary artery disease) 06/03/2018  . CHEST PAIN-UNSPECIFIED 03/01/2009   Qualifier: Diagnosis of  By: Mare Ferrari, RMA, Sherri    . Diabetes mellitus ([place])   . Essential hypertension 03/01/2009   Qualifier: Diagnosis of  By: Mare Ferrari, [place], Sherri    . GERD 03/01/2009   Qualifier: Diagnosis of  By: Mare Ferrari, RMA, Sherri    . HYPERLIPIDEMIA-MIXED 03/01/2009   Qualifier: Diagnosis of  By: Mare Ferrari, RMA, Sherri    . Hypertension   . Hyperthyroidism   . HYPOTHYROIDISM 03/01/2009   Qualifier: Diagnosis of  By: Mare Ferrari, RMA, Sherri    . NSTEMI (non-ST elevated myocardial infarction) ([place]) 05/17/2018  . 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 Tingling in extremities 06/09/2014   No past surgical history pertinent negatives on file.   Scheduled Meds: Current Outpatient Medications on File Prior to Visit  Medication Sig Dispense Refill  . amiodarone (PACERONE) 200 MG tablet Take 2 tabs (463m) twice a day for 3 days then take 1 tab (2017m twice a day until we see you in follow-up. 70 tablet 1  . aspirin EC 325 MG EC tablet Take 1 tablet (325 mg total) by mouth daily. 30 tablet 0  . atorvastatin (LIPITOR) 80 MG tablet Take 1 tablet (80 mg total) by mouth daily at 6 PM. 30 tablet 1  . Cyanocobalamin 1000 MCG/ML KIT Inject 1 mL as directed every 30 (thirty) days.    . empagliflozin (JARDIANCE) 25 MG TABS tablet Take 25 mg by mouth daily.    . Marland Kitchensomeprazole (NEXIUM) 40 MG capsule Take 40 mg by mouth daily before breakfast.    . levothyroxine (SYNTHROID) 50 MCG tablet Take 50 mcg by mouth daily before breakfast.    . metoprolol tartrate (LOPRESSOR) 25 MG tablet Take 0.5 tablets (12.5 mg  total) by mouth 2 (two) times daily. 60 tablet 1  . ranitidine (ZANTAC) 300 MG tablet Take 300 mg by mouth as needed.      No current facility-administered medications on file prior to visit.   :  Allergies  Allergen Reactions  . Crestor [Rosuvastatin Calcium] Other (See Comments)    Malaise    . Lipitor [Atorvastatin Calcium] Other (See Comments)    malaise  . Metformin And Related Other (See Comments)    GI Intolerance   . Niacin And Related Other (See Comments)    Fatigue and chest pain  . Viibryd [V[employer]cl] Other (See Comments)    Hypomania    Active Problems:   * No active hospital problems. *  Blood pressure 127/84, pulse 70, resp. rate 18, height 5' 9"  (1.753 m), weight 114.4 kg, SpO2 95 %.  Subjective  Overall Nicholas Chambers doing quite well.  He is walking several times a day.  He does still experience some incisional chest soreness but is no longer taking any narcotic pain medicine.  He cannot work cardiac rehab into his schedule therefore he plans to do this at home.  Objective  Cor:RRR, no murmur Pulm:CTA bilaterally and in all fields Adb:no tenderness, + bowel sounds Wound: clean and dry without drainage, EVH site well healed Ext: no edema  Assessment & Plan  Nicholas Chambers  a 57 year old male who is here for his routine follow-up visit status post coronary bypass grafting x4 with Dr. Cyndia Bent on 05/19/2017.  Overall, he is doing quite well post surgery.  He has been walking routinely without any shortness of breath.  However, he has had 2 periods of a few seconds where he felt short of breath and dizzy. Dr. Jenne Campus his cardiologist plans to place a Holter monitor on the patient this Friday for further rhythm monitoring.  He does remain on amiodarone 200 mg twice daily and is also on metoprolol 12.5 mg twice daily.  I will let cardiology titrate his amiodarone based on recent potential rhythm issues.  Today he is in normal sinus rhythm and feels well.   He asked when he is able to return to work.  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  I stated from our perspective 8 weeks post surgery would be reasonable to return to work.  However, cardiology may have a different idea on when it is appropriate to return to work based on his rhythm.  I have not made any medication changes during this appointment.  Since he is no longer requiring narcotic pain medication he may drive a motor vehicle which he has already started to do.  He has had no surgical issues since surgery and may follow-up with our office on an as-needed basis.  He may call our office with any questions or concerns.  He is to follow-up with his cardiologist on Friday for placement of his Holter monitor.  Elgie Collard 06/21/2018

## 2018-06-25 ENCOUNTER — Ambulatory Visit (INDEPENDENT_AMBULATORY_CARE_PROVIDER_SITE_OTHER): Payer: BLUE CROSS/BLUE SHIELD

## 2018-06-25 DIAGNOSIS — I480 Paroxysmal atrial fibrillation: Secondary | ICD-10-CM

## 2018-08-13 ENCOUNTER — Other Ambulatory Visit: Payer: Self-pay | Admitting: Emergency Medicine

## 2018-08-13 ENCOUNTER — Telehealth: Payer: Self-pay | Admitting: Cardiology

## 2018-08-13 MED ORDER — AMIODARONE HCL 200 MG PO TABS: 200.0000 mg | ORAL_TABLET | Freq: Every day | ORAL | 3 refills | Status: DC

## 2018-08-13 NOTE — Telephone Encounter (Signed)
Patient denies any chest pain. He reports palpations and needs a refill of his medication. We will refill his medication and patient will call us if things get worse. Patient advised to go to the emergency department if having active chest pain. Patient verbally understands.

## 2018-08-13 NOTE — Telephone Encounter (Signed)
°  Patient is out of medication and currently having mild chest pain   1. Which medications need to be refilled? (please list name of each medication and dose if known) amiodarone 200mg   2. Which pharmacy/location (including street and city if local pharmacy) is medication to be sent to? [facility]  3. Do they need a 30 day or 90 day supply? 90

## 2018-09-21 ENCOUNTER — Encounter: Payer: Self-pay | Admitting: Cardiology

## 2018-09-21 ENCOUNTER — Ambulatory Visit: Payer: BLUE CROSS/BLUE SHIELD | Admitting: Cardiology

## 2018-09-21 VITALS — BP 140/82 | HR 54 | Resp 16 | Ht 68.0 in | Wt 248.0 lb

## 2018-09-21 DIAGNOSIS — I10 Essential (primary) hypertension: Secondary | ICD-10-CM

## 2018-09-21 DIAGNOSIS — Z951 Presence of aortocoronary bypass graft: Secondary | ICD-10-CM | POA: Diagnosis not present

## 2018-09-21 DIAGNOSIS — I2510 Atherosclerotic heart disease of native coronary artery without angina pectoris: Secondary | ICD-10-CM | POA: Diagnosis not present

## 2018-09-21 DIAGNOSIS — I200 Unstable angina: Secondary | ICD-10-CM

## 2018-09-21 NOTE — Progress Notes (Signed)
Cardiology Office Note:    Date:  09/21/2018   ID:  Nicholas Chambers, DOB [birth year], MRN [id number]  PCP:  Angelina Sheriff, MD  Cardiologist:  Jenne Campus, MD    Referring MD: Angelina Sheriff, MD   No chief complaint on file. I have chest pain  History of Present Illness:    Nicholas Chambers is a 57 y.o. male with coronary artery bypass graft done in December.  Is been doing very well recovering very nicely but lost for last 4 days he started experiencing pain in the left side of his chest he wakes up doing well and then couple hours later he usually started having pain in the left shoulder left arm that sensation lasted all day.  Pressing the chest wall or twisting moving his shoulder will make this pain happen.  This is something completely different as compared to the one he had before his bypass surgery he exercised on the regular basis every day in the morning he walks and have no difficulty doing it.  Past Medical History:  Diagnosis Date  . Atrial fibrillation ([place]) 05/21/2018   Postoperative following CABG  . CAD (coronary artery disease) 06/03/2018  . CHEST PAIN-UNSPECIFIED 03/01/2009   Qualifier: Diagnosis of  By: Mare Ferrari, RMA, Sherri    . Diabetes mellitus ([place])   . Essential hypertension 03/01/2009   Qualifier: Diagnosis of  By: Mare Ferrari, [place], Sherri    . GERD 03/01/2009   Qualifier: Diagnosis of  By: Mare Ferrari, RMA, Sherri    . HYPERLIPIDEMIA-MIXED 03/01/2009   Qualifier: Diagnosis of  By: Mare Ferrari, RMA, Sherri    . Hypertension   . Hyperthyroidism   . HYPOTHYROIDISM 03/01/2009   Qualifier: Diagnosis of  By: Mare Ferrari, RMA, Sherri    . NSTEMI (non-ST elevated myocardial infarction) ([place]) 05/17/2018  . 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 Tingling in extremities 06/09/2014    Past Surgical History:  Procedure Laterality Date  . CORONARY ARTERY BYPASS GRAFT N/A 05/19/2018   Procedure: CORONARY ARTERY BYPASS GRAFTING (CABG) x 4 WITH ENDOSCOPIC HARVESTING OF RIGHT SAPHENOUS  VEIN;  Surgeon: Gaye Pollack, MD;  Location: [facility];  Service: Open Heart Surgery;  Laterality: N/A;  . LEFT HEART CATH AND CORONARY ANGIOGRAPHY N/A 05/18/2018   Procedure: LEFT HEART CATH AND CORONARY ANGIOGRAPHY;  Surgeon: Belva Crome, MD;  Location: [place] [facility];  Service: Cardiovascular;  Laterality: N/A;  . SHOULDER SURGERY     one on R, two on L  . TEE WITHOUT CARDIOVERSION N/A 05/19/2018   Procedure: TRANSESOPHAGEAL ECHOCARDIOGRAM (TEE);  Surgeon: Gaye Pollack, MD;  Location: [place];  Service: Open Heart Surgery;  Laterality: N/A;  . UMBILICAL HERNIA REPAIR      Current Medications: Current Meds  Medication Sig  . amiodarone (PACERONE) 200 MG tablet Take 1 tablet (200 mg total) by mouth daily.  [name] aspirin EC 325 MG EC tablet Take 1 tablet (325 mg total) by mouth daily.  [name] atorvastatin (LIPITOR) 80 MG tablet Take 1 tablet (80 mg total) by mouth daily at 6 PM.  . Cyanocobalamin 1000 MCG/ML KIT Inject 1 mL as directed every 30 (thirty) days.  . empagliflozin (JARDIANCE) 25 MG TABS tablet Take 25 mg by mouth daily.  [name] esomeprazole (NEXIUM) 40 MG capsule Take 40 mg by mouth daily before breakfast.  . levothyroxine (SYNTHROID) 50 MCG tablet Take 50 mcg by mouth daily before breakfast.  . metoprolol tartrate (LOPRESSOR) 25 MG tablet Take 0.5 tablets (  12.5 mg total) by mouth 2 (two) times daily.  . ranitidine (ZANTAC) 300 MG tablet Take 300 mg by mouth as needed.      Allergies:   Crestor [rosuvastatin calcium]; Lipitor [atorvastatin calcium]; Metformin and related; Niacin and related; and Viibryd [vilazodone hcl]   Social History   Socioeconomic History  . Marital status: Married    Spouse name: Not on file  . Number of children: Not on file  . Years of education: Not on file  . Highest education level: Not on file  Occupational History  . Not on file  Social Needs  . Financial resource strain: Not on file  . Food insecurity:    Worry: Not on file    Inability: Not on  file  . Transportation needs:    Medical: Not on file    Non-medical: Not on file  Tobacco Use  . Smoking status: Never Smoker  . Smokeless tobacco: Never Used  Substance and Sexual Activity  . Alcohol use: Never    Frequency: Never  . Drug use: Never  . Sexual activity: Not Currently  Lifestyle  . Physical activity:    Days per week: Not on file    Minutes per session: Not on file  . Stress: Not on file  Relationships  . Social connections:    Talks on phone: Not on file    Gets together: Not on file    Attends religious service: Not on file    Active member of club or organization: Not on file    Attends meetings of clubs or organizations: Not on file    Relationship status: Not on file  Other Topics Concern  . Not on file  Social History Narrative  . Not on file     Family History: The patient's family history includes Diabetes in his brother. ROS:   Please see the history of present illness.    All 14 point review of systems negative except as described per history of present illness  EKGs/Labs/Other Studies Reviewed:    EKG showed sinus bradycardia rate of 50 normal P interval normal QS complex duration morphology nonspecific ST segment changes.  Recent Labs: 05/19/2018: TSH 9.385 05/20/2018: Magnesium 2.2 05/22/2018: BUN 10; Creatinine, Ser 1.12; Hemoglobin 11.4; Platelets 162; Potassium 3.5; Sodium 134  Recent Lipid Panel    Component Value Date/Time   CHOL 240 (H) 05/18/2018 0028   TRIG 296 (H) 05/18/2018 0028   HDL 40 (L) 05/18/2018 0028   CHOLHDL 6.0 05/18/2018 0028   VLDL 59 (H) 05/18/2018 0028   LDLCALC 141 (H) 05/18/2018 0028    Physical Exam:    VS:  BP 140/82 (BP Location: Right Arm, Patient Position: Sitting)   Pulse (!) 54   Resp 16   Ht 5' 8" (1.727 m)   Wt 248 lb (112.5 kg)   SpO2 99%   BMI 37.71 kg/m     Wt Readings from Last 3 Encounters:  09/21/18 248 lb (112.5 kg)  06/21/18 252 lb 3.2 oz (114.4 kg)  06/08/18 255 lb (115.7 kg)       GEN:  Well nourished, well developed in no acute distress HEENT: Normal NECK: No JVD; No carotid bruits LYMPHATICS: No lymphadenopathy CARDIAC: RRR, no murmurs, no rubs, no gallops RESPIRATORY:  Clear to auscultation without rales, wheezing or rhonchi  ABDOMEN: Soft, non-tender, non-distended MUSCULOSKELETAL:  No edema; No deformity  SKIN: Warm and dry LOWER EXTREMITIES: no swelling NEUROLOGIC:  Alert and oriented x 3 PSYCHIATRIC:  Normal  affect   ASSESSMENT:    1. Unstable angina pectoris ([place])   2. S/P CABG x 4   3. Coronary artery disease involving native coronary artery of native heart without angina pectoris   4. Essential hypertension    PLAN:    In order of problems listed above:  1. Coronary artery disease overall doing well from that point review.  The symptoms that he presented to my office today is something completely different that he had before I doubt very much this is cardiac pain I think this is musculoskeletal pain I asked him to take some Motrin and take 200 mg 3 times daily to see if that will give him relief I still will see him back in a month to make sure he is doing well.   2. Essential hypertension blood pressure well controlled continue present medications. 3. Dyslipidemia on statin followed by internal medicine team will continue present management.  We talked about healthy lifestyle and exercises on the regular basis.   Medication Adjustments/Labs and Tests Ordered: Current medicines are reviewed at length with the patient today.  Concerns regarding medicines are outlined above.  Orders Placed This Encounter  Procedures  . EKG 12-Lead   Medication changes: No orders of the defined types were placed in this encounter.   Signed, Park Liter, MD, [facility] 09/21/2018 2:05 PM    [place]

## 2018-09-21 NOTE — Patient Instructions (Signed)
Medication Instructions:  Your physician recommends that you continue on your current medications as directed. Please refer to the Current Medication list given to you today.  If you need a refill on your cardiac medications before your next appointment, please call your pharmacy.   Lab work: None ordered If you have labs (blood work) drawn today and your tests are completely normal, you will receive your results only by: . MyChart Message (if you have MyChart) OR . A paper copy in the mail If you have any lab test that is abnormal or we need to change your treatment, we will call you to review the results.  Testing/Procedures: None ordered  Follow-Up: At [facility], you and your health needs are our priority.  As part of our continuing mission to provide you with exceptional heart care, we have created designated Provider Care Teams.  These Care Teams include your primary Cardiologist (physician) and Advanced Practice Providers (APPs -  Physician Assistants and Nurse Practitioners) who all work together to provide you with the care you need, when you need it. You will need a follow up appointment in 1 months.  Please call our office 2 months in advance to schedule this appointment.  You may see  Robert Krasowski or another member of our [facility] Provider Team in [place]: Brian Munley, MD . Rajan Revankar, MD  Any Other Special Instructions Will Be Listed Below (If Applicable).    

## 2018-09-27 ENCOUNTER — Other Ambulatory Visit: Payer: Self-pay | Admitting: Emergency Medicine

## 2018-09-27 MED ORDER — METOPROLOL TARTRATE 25 MG PO TABS: 12.5000 mg | ORAL_TABLET | Freq: Two times a day (BID) | ORAL | 1 refills | Status: DC

## 2018-10-14 ENCOUNTER — Other Ambulatory Visit: Payer: Self-pay

## 2018-10-14 MED ORDER — AMIODARONE HCL 200 MG PO TABS: 200.0000 mg | ORAL_TABLET | Freq: Every day | ORAL | 1 refills | Status: DC

## 2018-10-14 MED ORDER — METOPROLOL TARTRATE 25 MG PO TABS: 12.5000 mg | ORAL_TABLET | Freq: Two times a day (BID) | ORAL | 1 refills | Status: DC

## 2018-10-25 DIAGNOSIS — Z6837 Body mass index (BMI) 37.0-37.9, adult: Secondary | ICD-10-CM | POA: Diagnosis not present

## 2018-10-25 DIAGNOSIS — E1129 Type 2 diabetes mellitus with other diabetic kidney complication: Secondary | ICD-10-CM | POA: Diagnosis not present

## 2018-10-25 DIAGNOSIS — Z23 Encounter for immunization: Secondary | ICD-10-CM | POA: Diagnosis not present

## 2018-10-25 DIAGNOSIS — R809 Proteinuria, unspecified: Secondary | ICD-10-CM | POA: Diagnosis not present

## 2018-11-01 ENCOUNTER — Ambulatory Visit (INDEPENDENT_AMBULATORY_CARE_PROVIDER_SITE_OTHER): Payer: BLUE CROSS/BLUE SHIELD | Admitting: Cardiology

## 2018-11-01 ENCOUNTER — Encounter: Payer: Self-pay | Admitting: Cardiology

## 2018-11-01 VITALS — BP 122/76 | HR 68 | Ht 69.0 in | Wt 254.6 lb

## 2018-11-01 DIAGNOSIS — I2510 Atherosclerotic heart disease of native coronary artery without angina pectoris: Secondary | ICD-10-CM | POA: Diagnosis not present

## 2018-11-01 DIAGNOSIS — I214 Non-ST elevation (NSTEMI) myocardial infarction: Secondary | ICD-10-CM | POA: Diagnosis not present

## 2018-11-01 DIAGNOSIS — Z951 Presence of aortocoronary bypass graft: Secondary | ICD-10-CM | POA: Diagnosis not present

## 2018-11-01 DIAGNOSIS — R059 Cough, unspecified: Secondary | ICD-10-CM

## 2018-11-01 DIAGNOSIS — R05 Cough: Secondary | ICD-10-CM

## 2018-11-01 DIAGNOSIS — I480 Paroxysmal atrial fibrillation: Secondary | ICD-10-CM

## 2018-11-01 NOTE — Addendum Note (Signed)
Addended by: Lita MainsBOWMAN, Idalys Konecny R on: 11/01/2018 03:48 PM   Modules accepted: Orders

## 2018-11-01 NOTE — Patient Instructions (Signed)
Medication Instructions:  Your physician recommends that you continue on your current medications as directed. Please refer to the Current Medication list given to you today.  If you need a refill on your cardiac medications before your next appointment, please call your pharmacy.   Lab work: None.  If you have labs (blood work) drawn today and your tests are completely normal, you will receive your results only by: [name]. MyChart Message (if you have MyChart) OR . A paper copy in the mail If you have any lab test that is abnormal or we need to change your treatment, we will call you to review the results.  Testing/Procedures: A chest x-ray takes a picture of the organs and structures inside the chest, including the heart, lungs, and blood vessels. This test can show several things, including, whether the heart is enlarges; whether fluid is building up in the lungs; and whether pacemaker / defibrillator leads are still in place.  Your physician has recommended that you wear a holter monitor. Holter monitors are medical devices that record the heart's electrical activity. Doctors most often use these monitors to diagnose arrhythmias. Arrhythmias are problems with the speed or rhythm of the heartbeat. The monitor is a small, portable device. You can wear one while you do your normal daily activities. This is usually used to diagnose what is causing palpitations/syncope (passing out). Wear for 48 hours.     Follow-Up: [facility], you and your health needs are our priority.  As part of our continuing mission to provide you with exceptional heart care, we have created designated Provider Care Teams.  These Care Teams include your primary Cardiologist (physician) and Advanced Practice Providers (APPs -  Physician Assistants and Nurse Practitioners) who all work together to provide you with the care you need, when you need it. You will need a follow up appointment in 3 months.  Please call our  office 2 months in advance to schedule this appointment.  You may see No primary care provider on file. or another member of our [facility] Team in [place]: Norman HerrlichBrian Munley, MD . Belva Cromeajan Revankar, MD  Any Other Special Instructions Will Be Listed Below (If Applicable).    Chest X-Ray  A chest X-ray is a painless test that uses radiation to create images of the structures inside of your chest. Chest X-rays are used to look for many health conditions, including heart failure, pneumonia, tuberculosis, rib fractures, breathing disorders, and cancer. They may be used to diagnose chest pain, constant coughing, or trouble breathing. Tell a health care provider about:  Any allergies you have.  All medicines you are taking, including vitamins, herbs, eye drops, creams, and over-the-counter medicines.  Any surgeries you have had.  Any medical conditions you have.  Whether you are pregnant or may be pregnant. What are the risks? Getting a chest X-ray is a safe procedure. However, you will be exposed to a small amount of radiation. Being exposed to too much radiation over a lifetime can increase the risk of cancer. This risk is small, but it may occur if you have many X-rays throughout your life. What happens before the procedure?  You may be asked to remove glasses, jewelry, and any other metal objects.  You will be asked to undress from the waist up. You may be given a hospital gown to wear.  You may be asked to wear a protective lead apron to protect parts of your body from radiation. What happens during the procedure?  You will be asked to stand still as each picture is taken to get the best possible images.  You will be asked to take a deep breath and hold your breath for a few seconds.  The X-ray machine will create a picture of your chest using a tiny burst of radiation. This is painless.  More pictures may be taken from other angles. Typically, one picture will be taken  while you face the X-ray camera, and another picture will be taken from the side while you stand. If you cannot stand, you may be asked to lie down. The procedure may vary among health care providers and hospitals. What happens after the procedure?  The X-ray(s) will be reviewed by your health care provider or an X-ray (radiology) specialist.  It is up to you to get your test results. Ask your health care provider, or the department that is doing the test, when your results will be ready.  Your health care provider will tell you if you need more tests or a follow-up exam. Keep all follow-up visits as told by your health care provider. This is important. Summary  A chest X-ray is a safe, painless test that is used to examine the inside of the chest, heart, and lungs.  You will need to undress from the waist up and remove jewelry and metal objects before the procedure.  You will be exposed to a small amount of radiation during the procedure.  The X-ray machine will take one or more pictures of your chest while you remain as still as possible.  Later, a health care provider or specialist will review the test results with you. This information is not intended to replace advice given to you by your health care provider. Make sure you discuss any questions you have with your health care provider. Document Released: 12/23/2016 Document Revised: 12/23/2016 Document Reviewed: 12/23/2016 Elsevier Interactive Patient Education  2019 Elsevier Inc.   Ambulatory Cardiac Monitoring An ambulatory cardiac monitor is a small recording device that is used to detect abnormal heart rhythms (arrhythmias). Most monitors are connected by wires to flat, sticky disks (electrodes) that are then attached to your chest. You may need to wear a monitor if you have had symptoms such as:  Fast heartbeats (palpitations).  Dizziness.  Fainting or light-headedness.  Unexplained weakness.  Shortness of  breath. There are several types of monitors. Some common monitors include:  Holter monitor. This records your heart rhythm continuously, usually for 24-48 hours.  Event (episodic) monitor. This monitor has a symptoms button, and when pushed, it will begin recording. You need to activate this monitor to record when you have a heart-related symptom.  Automatic detection monitor. This monitor will begin recording when it detects an abnormal heartbeat. What are the risks? Generally, these devices are safe to use. However, it is possible that the skin under the electrodes will become irritated. How to prepare for monitoring Your health care provider will prepare your chest for the electrode placement and show you how to use the monitor.  Do not apply lotions to your chest before monitoring.  Follow directions on how to care for the monitor, and how to return the monitor when the testing period is complete. How to use your cardiac monitor  Follow directions about how long to wear the monitor, and if you can take the monitor off in order to shower or bathe. ? Do not let the monitor get wet. ? Do not bathe, swim, or use a hot  tub while wearing the monitor.  Keep your skin clean. Do not put body lotion or moisturizer on your chest.  Change the electrodes as told by your health care provider, or any time they stop sticking to your skin. You may need to use medical tape to keep them on.  Try to put the electrodes in slightly different places on your chest to help prevent skin irritation. Follow directions from your health care provider about where to place the electrodes.  Make sure the monitor is safely clipped to your clothing or in a location close to your body as recommended by your health care provider.  If your monitor has a symptoms button, press the button to mark an event as soon as you feel a heart-related symptom, such  as: ? Dizziness. ? Weakness. ? Light-headedness. ? Palpitations. ? Thumping or pounding in your chest. ? Shortness of breath. ? Unexplained weakness.  Keep a diary of your activities, such as walking, doing chores, and taking medicine. It is very important to note what you were doing when you pushed the button to record your symptoms. This will help your health care provider determine what might be contributing to your symptoms.  Send the recorded information as recommended by your health care provider. It may take some time for your health care provider to process the results.  Change the batteries as told by your health care provider.  Keep electronic devices away from your monitor. These include: ? Tablets. ? MP3 players. ? Cell phones.  While wearing your monitor you should avoid: ? Electric blankets. ? Firefighterlectric razors. ? Electric toothbrushes. ? Microwave ovens. ? Magnets. ? Metal detectors. Get help right away if:  You have chest pain.  You have shortness of breath or extreme difficulty breathing.  You develop a very fast heartbeat that does not get better.  You develop dizziness that does not go away.  You faint or constantly feel like you are about to faint. Summary  An ambulatory cardiac monitor is a small recording device that is used to detect abnormal heart rhythms (arrhythmias).  Make sure you understand how to send the information from the monitor to your health care provider.  It is important to press the button on the monitor when you have any heart-related symptoms.  Keep a diary of your activities, such as walking, doing chores, and taking medicine. It is very important to note what you were doing when you pushed the button to record your symptoms. This will help your health care provider learn what might be causing your symptoms. This information is not intended to replace advice given to you by your health care provider. Make sure you discuss any  questions you have with your health care provider. Document Released: 08/05/2008 Document Revised: 08/12/2017 Document Reviewed: 10/11/2016 Elsevier Interactive Patient Education  2019 [employer]

## 2018-11-01 NOTE — Progress Notes (Signed)
Cardiology Office Note:    Date:  11/01/2018   ID:  Nicholas Chambers, DOB 07-21-1961, MRN [id number]  PCP:  Angelina Sheriff, MD  Cardiologist:  Jenne Campus, MD    Referring MD: Angelina Sheriff, MD   Chief Complaint  Patient presents with  . Follow-up  Doing well  History of Present Illness:    Nicholas Chambers is a 57 y.o. male with coronary artery disease status post coronary artery bypass graft done in summer of this year overall doing well he was complaining of having some atypical chest pain not related to exercise in the left shoulder but he is slow down with elliptical pain is much better described to have some cough with some sputum production sometimes but no fever or chills.  Denies having any palpitations except when he lays down and left side of his chest he usually feel his heart beating somewhat forcefully.  Past Medical History:  Diagnosis Date  . Atrial fibrillation ([place]) 05/21/2018   Postoperative following CABG  . CAD (coronary artery disease) 06/03/2018  . CHEST PAIN-UNSPECIFIED 03/01/2009   Qualifier: Diagnosis of  By: Mare Ferrari, RMA, Sherri    . Diabetes mellitus ([place])   . Essential hypertension 03/01/2009   Qualifier: Diagnosis of  By: Mare Ferrari, [place], Sherri    . GERD 03/01/2009   Qualifier: Diagnosis of  By: Mare Ferrari, RMA, Sherri    . HYPERLIPIDEMIA-MIXED 03/01/2009   Qualifier: Diagnosis of  By: Mare Ferrari, RMA, Sherri    . Hypertension   . Hyperthyroidism   . HYPOTHYROIDISM 03/01/2009   Qualifier: Diagnosis of  By: Mare Ferrari, RMA, Sherri    . NSTEMI (non-ST elevated myocardial infarction) ([place]) 05/17/2018  . 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 Tingling in extremities 06/09/2014    Past Surgical History:  Procedure Laterality Date  . CORONARY ARTERY BYPASS GRAFT N/A 05/19/2018   Procedure: CORONARY ARTERY BYPASS GRAFTING (CABG) x 4 WITH ENDOSCOPIC HARVESTING OF RIGHT SAPHENOUS VEIN;  Surgeon: Gaye Pollack, MD;  Location: [facility];  Service: Open Heart Surgery;   Laterality: N/A;  . LEFT HEART CATH AND CORONARY ANGIOGRAPHY N/A 05/18/2018   Procedure: LEFT HEART CATH AND CORONARY ANGIOGRAPHY;  Surgeon: Belva Crome, MD;  Location: [facility];  Service: Cardiovascular;  Laterality: N/A;  . SHOULDER SURGERY     one on R, two on L  . TEE WITHOUT CARDIOVERSION N/A 05/19/2018   Procedure: TRANSESOPHAGEAL ECHOCARDIOGRAM (TEE);  Surgeon: Gaye Pollack, MD;  Location: [facility];  Service: Open Heart Surgery;  Laterality: N/A;  . UMBILICAL HERNIA REPAIR      Current Medications: Current Meds  Medication Sig  . amiodarone (PACERONE) 200 MG tablet Take 1 tablet (200 mg total) by mouth daily.  [name] aspirin EC 325 MG EC tablet Take 1 tablet (325 mg total) by mouth daily.  [name] atorvastatin (LIPITOR) 80 MG tablet Take 1 tablet (80 mg total) by mouth daily at 6 PM.  . Cyanocobalamin 1000 MCG/ML KIT Inject 1 mL as directed every 30 (thirty) days.  . empagliflozin (JARDIANCE) 25 MG TABS tablet Take 25 mg by mouth daily.  [name] esomeprazole (NEXIUM) 40 MG capsule Take 40 mg by mouth daily before breakfast.  . levothyroxine (SYNTHROID) 50 MCG tablet Take 50 mcg by mouth daily before breakfast.  . metoprolol tartrate (LOPRESSOR) 25 MG tablet Take 0.5 tablets (12.5 mg total) by mouth 2 (two) times daily.  . ranitidine (ZANTAC) 300 MG tablet Take 300 mg by mouth as  needed.      Allergies:   Crestor [rosuvastatin calcium]; Lipitor [atorvastatin calcium]; Metformin and related; Niacin and related; and Viibryd [vilazodone hcl]   Social History   Socioeconomic History  . Marital status: Married    Spouse name: Not on file  . Number of children: Not on file  . Years of education: Not on file  . Highest education level: Not on file  Occupational History  . Not on file  Social Needs  . Financial resource strain: Not on file  . Food insecurity:    Worry: Not on file    Inability: Not on file  . Transportation needs:    Medical: Not on file    Non-medical: Not on file    Tobacco Use  . Smoking status: Never Smoker  . Smokeless tobacco: Never Used  Substance and Sexual Activity  . Alcohol use: Never    Frequency: Never  . Drug use: Never  . Sexual activity: Not Currently  Lifestyle  . Physical activity:    Days per week: Not on file    Minutes per session: Not on file  . Stress: Not on file  Relationships  . Social connections:    Talks on phone: Not on file    Gets together: Not on file    Attends religious service: Not on file    Active member of club or organization: Not on file    Attends meetings of clubs or organizations: Not on file    Relationship status: Not on file  Other Topics Concern  . Not on file  Social History Narrative  . Not on file     Family History: The patient's family history includes Diabetes in his brother. ROS:   Please see the history of present illness.    All 14 point review of systems negative except as described per history of present illness  EKGs/Labs/Other Studies Reviewed:      Recent Labs: 05/19/2018: TSH 9.385 05/20/2018: Magnesium 2.2 05/22/2018: BUN 10; Creatinine, Ser 1.12; Hemoglobin 11.4; Platelets 162; Potassium 3.5; Sodium 134  Recent Lipid Panel    Component Value Date/Time   CHOL 240 (H) 05/18/2018 0028   TRIG 296 (H) 05/18/2018 0028   HDL 40 (L) 05/18/2018 0028   CHOLHDL 6.0 05/18/2018 0028   VLDL 59 (H) 05/18/2018 0028   LDLCALC 141 (H) 05/18/2018 0028    Physical Exam:    VS:  BP 122/76   Pulse 68   Ht 5' 9"  (1.753 m)   Wt 254 lb 9.6 oz (115.5 kg)   SpO2 97%   BMI 37.60 kg/m     Wt Readings from Last 3 Encounters:  11/01/18 254 lb 9.6 oz (115.5 kg)  09/21/18 248 lb (112.5 kg)  06/21/18 252 lb 3.2 oz (114.4 kg)     GEN:  Well nourished, well developed in no acute distress HEENT: Normal NECK: No JVD; No carotid bruits LYMPHATICS: No lymphadenopathy CARDIAC: RRR, no murmurs, no rubs, no gallops RESPIRATORY:  Clear to auscultation without rales, wheezing or rhonchi  poor air entry on the left side with some crackles and rhonchi ABDOMEN: Soft, non-tender, non-distended MUSCULOSKELETAL:  No edema; No deformity  SKIN: Warm and dry LOWER EXTREMITIES: no swelling NEUROLOGIC:  Alert and oriented x 3 PSYCHIATRIC:  Normal affect   ASSESSMENT:    1. S/P CABG x 4   2. NSTEMI (non-ST elevated myocardial infarction) ([place])   3. Coronary artery disease involving native coronary artery of native heart without angina pectoris  4. Paroxysmal atrial fibrillation ([facility])    PLAN:    In order of problems listed above:  1. Status post coronary artery bypass graft wound healed completely he recovered nicely 2. History of non-STEMI.  Noted appropriate medications. 3. Paroxysmal atrial fibrillation denies having any sustained arrhythmia but anytime he lay down on the left side he feels his heart beating forcefully he still on amiodarone which I will continue however asking to wear 48 hours Holter monitor in January to see if there is an episode of atrial fibrillation if there is none we will discontinue amiodarone. 4. Dyslipidemia he is on Lipitor which I will continue. 5. Some Rales and crackles of her left base of her lungs: We will get chest x-ray   Medication Adjustments/Labs and Tests Ordered: Current medicines are reviewed at length with the patient today.  Concerns regarding medicines are outlined above.  No orders of the defined types were placed in this encounter.  Medication changes: No orders of the defined types were placed in this encounter.   Signed, Park Liter, MD, [facility] 11/01/2018 3:32 PM    [place]

## 2018-11-12 ENCOUNTER — Telehealth: Payer: Self-pay | Admitting: Emergency Medicine

## 2018-11-12 NOTE — Telephone Encounter (Signed)
Called to check on patient per Dr. Bing Matter. Patient feels better and cough has gotten much better.

## 2018-11-17 ENCOUNTER — Ambulatory Visit (INDEPENDENT_AMBULATORY_CARE_PROVIDER_SITE_OTHER): Payer: BLUE CROSS/BLUE SHIELD

## 2018-11-17 DIAGNOSIS — I2510 Atherosclerotic heart disease of native coronary artery without angina pectoris: Secondary | ICD-10-CM | POA: Diagnosis not present

## 2018-11-17 DIAGNOSIS — I214 Non-ST elevation (NSTEMI) myocardial infarction: Secondary | ICD-10-CM

## 2018-11-17 DIAGNOSIS — I480 Paroxysmal atrial fibrillation: Secondary | ICD-10-CM | POA: Diagnosis not present

## 2019-01-06 DIAGNOSIS — E1121 Type 2 diabetes mellitus with diabetic nephropathy: Secondary | ICD-10-CM | POA: Diagnosis not present

## 2019-01-06 DIAGNOSIS — Z6839 Body mass index (BMI) 39.0-39.9, adult: Secondary | ICD-10-CM | POA: Diagnosis not present

## 2019-01-06 DIAGNOSIS — R202 Paresthesia of skin: Secondary | ICD-10-CM | POA: Diagnosis not present

## 2019-01-06 DIAGNOSIS — R358 Other polyuria: Secondary | ICD-10-CM | POA: Diagnosis not present

## 2019-02-01 ENCOUNTER — Telehealth (INDEPENDENT_AMBULATORY_CARE_PROVIDER_SITE_OTHER): Payer: BLUE CROSS/BLUE SHIELD | Admitting: Cardiology

## 2019-02-01 ENCOUNTER — Telehealth: Payer: Self-pay | Admitting: Emergency Medicine

## 2019-02-01 ENCOUNTER — Other Ambulatory Visit: Payer: Self-pay

## 2019-02-01 ENCOUNTER — Encounter: Payer: Self-pay | Admitting: Cardiology

## 2019-02-01 VITALS — BP 128/87 | HR 86 | Ht 69.0 in | Wt 255.0 lb

## 2019-02-01 DIAGNOSIS — I2510 Atherosclerotic heart disease of native coronary artery without angina pectoris: Secondary | ICD-10-CM | POA: Diagnosis not present

## 2019-02-01 DIAGNOSIS — E785 Hyperlipidemia, unspecified: Secondary | ICD-10-CM | POA: Diagnosis not present

## 2019-02-01 DIAGNOSIS — I10 Essential (primary) hypertension: Secondary | ICD-10-CM

## 2019-02-01 DIAGNOSIS — Z951 Presence of aortocoronary bypass graft: Secondary | ICD-10-CM

## 2019-02-01 MED ORDER — AMIODARONE HCL 200 MG PO TABS: 100.0000 mg | ORAL_TABLET | Freq: Every day | ORAL | 0 refills | Status: DC

## 2019-02-01 NOTE — Patient Instructions (Signed)
Medication Instructions:  Your physician has recommended you make the following change in your medication:  Decrease: Amiodarone to 100 mg daily for 3 weeks, then stop medication.   If you need a refill on your cardiac medications before your next appointment, please call your pharmacy.   Lab work: None.  If you have labs (blood work) drawn today and your tests are completely normal, you will receive your results only by: [name] MyChart Message (if you have MyChart) OR . A paper copy in the mail If you have any lab test that is abnormal or we need to change your treatment, we will call you to review the results.  Testing/Procedures: None.   Follow-Up: At [facility], you and your health needs are our priority.  As part of our continuing mission to provide you with exceptional heart care, we have created designated Provider Care Teams.  These Care Teams include your primary Cardiologist (physician) and Advanced Practice Providers (APPs -  Physician Assistants and Nurse Practitioners) who all work together to provide you with the care you need, when you need it. You will need a follow up appointment in 3 months.  Please call our office 2 months in advance to schedule this appointment.  You may see No primary care provider on file. or another member of our [employer] Provider Team in [place]: Norman Herrlich, MD . Belva Crome, MD  Any Other Special Instructions Will Be Listed Below (If Applicable).

## 2019-02-01 NOTE — Telephone Encounter (Signed)
Confirmed virtual appointment with patient for 2:00 pm.

## 2019-02-01 NOTE — Progress Notes (Signed)
Evaluation Performed:  Follow-up visit  This visit type was conducted due to national recommendations for restrictions regarding the COVID-19 Pandemic (e.g. social distancing).  This format is felt to be most appropriate for this patient at this time.  All issues noted in this document were discussed and addressed.  No physical exam was performed (except for noted visual exam findings with Telehealth visits).  See MyChart message from today for the patient's consent to telehealth for [facility].  Date:  02/01/2019   ID:  Nicholas Chambers, DOB [birth year], MRN [id number]  Patient Location:  [address] [place] [address]   Provider location:   [place] office  PCP:  Angelina Sheriff, MD  Electrophysiologist:  None   Chief Complaint: Doing well  History of Present Illness:    Nicholas Chambers is a 58 y.o. male who presents via audio/video conferencing for a telehealth visit today.  His past medical history significant for coronary artery disease status post coronary artery bypass graft that he had done last year.  Dyslipidemia hypertension.  Overall he seems to be doing well denies have any chest pain tightness squeezing pressure burning chest.  The only complaint he has is the pain in the left shoulder which is worse with catching his back.  This is a chronic problem and he is actually determined to look for some help he is thinking about going to orthopedic doctor to have some advice.  He exercise on a regular basis he walks twice daily 46mnutes.  He tells me that if he walks he can talk on the phone therefore I encouraged him to do a little bit more aggressive walk.  He changed his dietary habits he is avoiding red meat he is trying to eat more fruits and vegetables.  In spite of that his weight is stable.  There is no drop in the weight now.  We did discuss his problem and he denies having any palpitations no dizziness no passing out.  The patient does not symptoms  concerning for COVID-19 infection (fever, chills, cough, or new SHORTNESS OF BREATH).    Prior CV studies:   The following studies were reviewed today:  Holter monitor done on November 18, 2018:  Conclusion:  Normal Holter monitor without atrial fibrillation  Interpreting  cardiologist: RJenne Campus MD  Date: 11/23/2018 5:47 PM  Past Medical History:  Diagnosis Date  . Atrial fibrillation ([place] 05/21/2018   Postoperative following CABG  . CAD (coronary artery disease) 06/03/2018  . CHEST PAIN-UNSPECIFIED 03/01/2009   Qualifier: Diagnosis of  By: FMare Ferrari RMA, Sherri    . Diabetes mellitus ([place]   . Essential hypertension 03/01/2009   Qualifier: Diagnosis of  By: FMare Ferrari [place] Sherri    . GERD 03/01/2009   Qualifier: Diagnosis of  By: FMare Ferrari RMA, Sherri    . HYPERLIPIDEMIA-MIXED 03/01/2009   Qualifier: Diagnosis of  By: FMare Ferrari RMA, Sherri    . Hypertension   . Hyperthyroidism   . HYPOTHYROIDISM 03/01/2009   Qualifier: Diagnosis of  By: FMare Ferrari RMA, Sherri    . NSTEMI (non-ST elevated myocardial infarction) ([place] 05/17/2018  . 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 Tingling in extremities 06/09/2014   Past Surgical History:  Procedure Laterality Date  . CORONARY ARTERY BYPASS GRAFT N/A 05/19/2018   Procedure: CORONARY ARTERY BYPASS GRAFTING (CABG) x 4 WITH ENDOSCOPIC HARVESTING OF RIGHT SAPHENOUS VEIN;  Surgeon: BGaye Pollack MD;  [place]  Service: Open Heart Surgery;  Laterality: N/A;  .  LEFT HEART CATH AND CORONARY ANGIOGRAPHY N/A 05/18/2018   Procedure: LEFT HEART CATH AND CORONARY ANGIOGRAPHY;  Surgeon: Belva Crome, MD;  Location: [place] [facility];  Service: Cardiovascular;  Laterality: N/A;  . SHOULDER SURGERY     one on R, two on L  . TEE WITHOUT CARDIOVERSION N/A 05/19/2018   Procedure: TRANSESOPHAGEAL ECHOCARDIOGRAM (TEE);  Surgeon: Gaye Pollack, MD;  Location: [place];  Service: Open Heart Surgery;  Laterality: N/A;  . UMBILICAL HERNIA REPAIR       Current Meds   Medication Sig  . amiodarone (PACERONE) 200 MG tablet Take 0.5 tablets (100 mg total) by mouth daily.  [name] aspirin EC 325 MG EC tablet Take 1 tablet (325 mg total) by mouth daily.  [name] atorvastatin (LIPITOR) 80 MG tablet Take 1 tablet (80 mg total) by mouth daily at 6 PM.  . Cyanocobalamin 1000 MCG/ML KIT Inject 1 mL as directed every 30 (thirty) days.  . empagliflozin (JARDIANCE) 25 MG TABS tablet Take 25 mg by mouth daily.  [name] esomeprazole (NEXIUM) 40 MG capsule Take 40 mg by mouth daily before breakfast.  . levothyroxine (SYNTHROID) 50 MCG tablet Take 50 mcg by mouth daily before breakfast.  . metoprolol tartrate (LOPRESSOR) 25 MG tablet Take 0.5 tablets (12.5 mg total) by mouth 2 (two) times daily.  . ranitidine (ZANTAC) 300 MG tablet Take 300 mg by mouth as needed.   . [DISCONTINUED] amiodarone (PACERONE) 200 MG tablet Take 1 tablet (200 mg total) by mouth daily.     Allergies:   Crestor [rosuvastatin calcium]; Lipitor [atorvastatin calcium]; Metformin and related; Niacin and related; and Viibryd [vilazodone hcl]   Social History   Tobacco Use  . Smoking status: Never Smoker  . Smokeless tobacco: Never Used  Substance Use Topics  . Alcohol use: Never    Frequency: Never  . Drug use: Never     Family Hx: The patient's family history includes Diabetes in his brother.  ROS:   Please see the history of present illness.    All 12 point review of system negative All other systems reviewed and are negative.   Labs/Other Tests and Data Reviewed:    Recent Labs: 05/19/2018: TSH 9.385 05/20/2018: Magnesium 2.2 05/22/2018: BUN 10; Creatinine, Ser 1.12; Hemoglobin 11.4; Platelets 162; Potassium 3.5; Sodium 134   Recent Lipid Panel Lab Results  Component Value Date/Time   CHOL 240 (H) 05/18/2018 12:28 AM   TRIG 296 (H) 05/18/2018 12:28 AM   HDL 40 (L) 05/18/2018 12:28 AM   CHOLHDL 6.0 05/18/2018 12:28 AM   LDLCALC 141 (H) 05/18/2018 12:28 AM    Wt Readings from Last 3  Encounters:  02/01/19 255 lb (115.7 kg)  11/01/18 254 lb 9.6 oz (115.5 kg)  09/21/18 248 lb (112.5 kg)     Exam:    Vital Signs:  BP 128/87   Pulse 86   Ht 5' 9"  (1.753 m)   Wt 255 lb (115.7 kg)   BMI 37.66 kg/m    Well nourished, well developed male in no acute distress.   ASSESSMENT & PLAN:    1.  Coronary artery disease: Stable from that point review we will continue present management. 2.  Paroxysmal atrial fibrillation: That happened only after bypass surgery.  He still taking 200 mg of amiodarone.  Last Holter monitor did not show any tachyarrhythmia.  Therefore I advised him to cut down amiodarone to only 100 mg daily.  I also told him if he has no palpitation  within the next month he can completely stop the medication.  We also reviewed his aspirin he takes 325 mg ask him to lower this to only 81 mg. 3.  Dyslipidemia: I will ask my staff to contact his primary care physician he told me that he got cholesterol done recently and was fine. 4.  Diabetes.  He check his sugar twice daily and it is always good.  We did review again need for exercises every single day as well as good diet and he is promised compliant with that.  COVID-19 Education: The signs and symptoms of COVID-19 were discussed with the patient and how to seek care for testing (follow up with PCP or arrange E-visit).  The importance of social distancing was discussed today.  Patient Risk:   After full review of this patients clinical status, I feel that they are at least moderate risk at this time.  Time:   Today, I have spent 18 minutes with the patient with telehealth technology discussing his health issues.     Medication Adjustments/Labs and Tests Ordered: Current medicines are reviewed at length with the patient today.  Concerns regarding medicines are outlined above.  Tests Ordered: No orders of the defined types were placed in this encounter.  Medication Changes: Meds ordered this encounter   Medications  . amiodarone (PACERONE) 200 MG tablet    Sig: Take 0.5 tablets (100 mg total) by mouth daily.    Dispense:  11 tablet    Refill:  0    Disposition:  3 months follow-up  Signed, Jenne Campus, MD  02/01/2019 2:50 PM    [facility]

## 2019-03-10 DIAGNOSIS — Z6839 Body mass index (BMI) 39.0-39.9, adult: Secondary | ICD-10-CM | POA: Diagnosis not present

## 2019-03-10 DIAGNOSIS — I10 Essential (primary) hypertension: Secondary | ICD-10-CM | POA: Diagnosis not present

## 2019-03-10 DIAGNOSIS — J3489 Other specified disorders of nose and nasal sinuses: Secondary | ICD-10-CM | POA: Diagnosis not present

## 2019-05-09 ENCOUNTER — Encounter: Payer: Self-pay | Admitting: Cardiology

## 2019-05-09 ENCOUNTER — Telehealth (INDEPENDENT_AMBULATORY_CARE_PROVIDER_SITE_OTHER): Payer: BC Managed Care – PPO | Admitting: Cardiology

## 2019-05-09 ENCOUNTER — Other Ambulatory Visit: Payer: Self-pay

## 2019-05-09 VITALS — BP 128/85 | Wt 260.0 lb

## 2019-05-09 DIAGNOSIS — E785 Hyperlipidemia, unspecified: Secondary | ICD-10-CM | POA: Diagnosis not present

## 2019-05-09 DIAGNOSIS — I10 Essential (primary) hypertension: Secondary | ICD-10-CM

## 2019-05-09 DIAGNOSIS — I480 Paroxysmal atrial fibrillation: Secondary | ICD-10-CM

## 2019-05-09 DIAGNOSIS — Z951 Presence of aortocoronary bypass graft: Secondary | ICD-10-CM

## 2019-05-09 NOTE — Patient Instructions (Signed)
Medication Instructions:  Your physician recommends that you continue on your current medications as directed. Please refer to the Current Medication list given to you today.  If you need a refill on your cardiac medications before your next appointment, please call your pharmacy.   Lab work: None.   If you have labs (blood work) drawn today and your tests are completely normal, you will receive your results only by: . MyChart Message (if you have MyChart) OR . A paper copy in the mail If you have any lab test that is abnormal or we need to change your treatment, we will call you to review the results.  Testing/Procedures: None.   Follow-Up: At [facility], you and your health needs are our priority.  As part of our continuing mission to provide you with exceptional heart care, we have created designated Provider Care Teams.  These Care Teams include your primary Cardiologist (physician) and Advanced Practice Providers (APPs -  Physician Assistants and Nurse Practitioners) who all work together to provide you with the care you need, when you need it. You will need a follow up appointment in 6 months.  Please call our office 2 months in advance to schedule this appointment.  You may see No primary care provider on file. or another member of our [facility] Provider Team in [place]: Brian Munley, MD . Rajan Revankar, MD  Any Other Special Instructions Will Be Listed Below (If Applicable).    

## 2019-05-09 NOTE — Progress Notes (Signed)
Virtual Visit via Telephone Note   This visit type was conducted due to national recommendations for restrictions regarding the COVID-19 Pandemic (e.g. social distancing) in an effort to limit this patient's exposure and mitigate transmission in our community.  Due to his co-morbid illnesses, this patient is at least at moderate risk for complications without adequate follow up.  This format is felt to be most appropriate for this patient at this time.  The patient did not have access to video technology/had technical difficulties with video requiring transitioning to audio format only (telephone).  All issues noted in this document were discussed and addressed.  No physical exam could be performed with this format.  Please refer to the patient's chart for his  consent to telehealth for [facility].  Evaluation Performed:  Follow-up visit  This visit type was conducted due to national recommendations for restrictions regarding the COVID-19 Pandemic (e.g. social distancing).  This format is felt to be most appropriate for this patient at this time.  All issues noted in this document were discussed and addressed.  No physical exam was performed (except for noted visual exam findings with Video Visits).  Please refer to the patient's chart (MyChart message for video visits and phone note for telephone visits) for the patient's consent to telehealth for [facility].  Date:  05/09/2019  ID: Nicholas Chambers, DOB December 21, 1960, MRN [id number]   Patient Location: [place] [address]   Provider location:   [place] Office  PCP:  Angelina Sheriff, MD  Cardiologist:  Jenne Campus, MD     Chief Complaint: Doing very well  History of Present Illness:    ULYSSE Chambers is a 58 y.o. male  who presents via audio/video conferencing for a telehealth visit today.  Status post coronary artery bypass graft year ago, dyslipidemia, hypertension, one episode of  atrial fibrillation after surgery.  He does have a virtual visit with me today he is doing very well asymptomatic no chest pain tightness squeezing pressure burning chest.  He works from home.  Admits that he is trying to do exercises but he understand that he need to do a little bit more.  Overall however doing well no palpitations in the matter-of-fact about a month ago he stopped completely taking his amiodarone.   The patient does not have symptoms concerning for COVID-19 infection (fever, chills, cough, or new SHORTNESS OF BREATH).    Prior CV studies:   The following studies were reviewed today:       Past Medical History:  Diagnosis Date  . Atrial fibrillation ([place]) 05/21/2018   Postoperative following CABG  . CAD (coronary artery disease) 06/03/2018  . CHEST PAIN-UNSPECIFIED 03/01/2009   Qualifier: Diagnosis of  By: Mare Ferrari, RMA, Sherri    . Diabetes mellitus ([place])   . Essential hypertension 03/01/2009   Qualifier: Diagnosis of  By: Mare Ferrari, [place], Sherri    . GERD 03/01/2009   Qualifier: Diagnosis of  By: Mare Ferrari, RMA, Sherri    . HYPERLIPIDEMIA-MIXED 03/01/2009   Qualifier: Diagnosis of  By: Mare Ferrari, RMA, Sherri    . Hypertension   . Hyperthyroidism   . HYPOTHYROIDISM 03/01/2009   Qualifier: Diagnosis of  By: Mare Ferrari, RMA, Sherri    . NSTEMI (non-ST elevated myocardial infarction) ([place]) 05/17/2018  . 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 Tingling in extremities 06/09/2014    Past Surgical History:  Procedure Laterality Date  . CORONARY ARTERY BYPASS GRAFT N/A 05/19/2018  Procedure: CORONARY ARTERY BYPASS GRAFTING (CABG) x 4 WITH ENDOSCOPIC HARVESTING OF RIGHT SAPHENOUS VEIN;  Surgeon: Gaye Pollack, MD;  Location: [place];  Service: Open Heart Surgery;  Laterality: N/A;  . LEFT HEART CATH AND CORONARY ANGIOGRAPHY N/A 05/18/2018   Procedure: LEFT HEART CATH AND CORONARY ANGIOGRAPHY;  Surgeon: Belva Crome, MD;  Location: [place] [facility];  Service: Cardiovascular;  Laterality: N/A;  .  SHOULDER SURGERY     one on R, two on L  . TEE WITHOUT CARDIOVERSION N/A 05/19/2018   Procedure: TRANSESOPHAGEAL ECHOCARDIOGRAM (TEE);  Surgeon: Gaye Pollack, MD;  Location: [place];  Service: Open Heart Surgery;  Laterality: N/A;  . UMBILICAL HERNIA REPAIR       Current Meds  Medication Sig  . amiodarone (PACERONE) 200 MG tablet Take 0.5 tablets (100 mg total) by mouth daily.  [name] aspirin EC 325 MG EC tablet Take 1 tablet (325 mg total) by mouth daily. (Patient taking differently: Take 162.5 mg by mouth daily. )  . atorvastatin (LIPITOR) 80 MG tablet Take 1 tablet (80 mg total) by mouth daily at 6 PM.  . Cyanocobalamin 1000 MCG/ML KIT Inject 1 mL as directed every 30 (thirty) days.  . empagliflozin (JARDIANCE) 25 MG TABS tablet Take 25 mg by mouth daily.  [name] esomeprazole (NEXIUM) 40 MG capsule Take 40 mg by mouth daily before breakfast.  . levothyroxine (SYNTHROID) 50 MCG tablet Take 50 mcg by mouth daily before breakfast.  . metoprolol tartrate (LOPRESSOR) 25 MG tablet Take 0.5 tablets (12.5 mg total) by mouth 2 (two) times daily.  . ranitidine (ZANTAC) 300 MG tablet Take 300 mg by mouth as needed.       Family History: The patient's family history includes Diabetes in his brother.   ROS:   Please see the history of present illness.     All other systems reviewed and are negative.   Labs/Other Tests and Data Reviewed:     Recent Labs: 05/19/2018: TSH 9.385 05/20/2018: Magnesium 2.2 05/22/2018: BUN 10; Creatinine, Ser 1.12; Hemoglobin 11.4; Platelets 162; Potassium 3.5; Sodium 134  Recent Lipid Panel    Component Value Date/Time   CHOL 240 (H) 05/18/2018 0028   TRIG 296 (H) 05/18/2018 0028   HDL 40 (L) 05/18/2018 0028   CHOLHDL 6.0 05/18/2018 0028   VLDL 59 (H) 05/18/2018 0028   LDLCALC 141 (H) 05/18/2018 0028      Exam:    Vital Signs:  BP 128/85   Wt 260 lb (117.9 kg)   BMI 38.40 kg/m     Wt Readings from Last 3 Encounters:  05/09/19 260 lb (117.9 kg)   02/01/19 255 lb (115.7 kg)  11/01/18 254 lb 9.6 oz (115.5 kg)     Well nourished, well developed in no acute distress. Alert oriented x3 very happy to be able to talk to me over the phone not in any distress he is at home I am sitting in our office in [place].  Diagnosis for this visit:   1. S/P CABG x 4   2. Dyslipidemia   3. Paroxysmal atrial fibrillation (HCC)   4. Essential hypertension      ASSESSMENT & PLAN:    1.  Status post coronary artery bypass graft doing well from that point review denies having any issues. 2.  Dyslipidemia apparently his fasting lipid profile being checked by primary care physician is excellent we will get a copy of it 3.  Paroxysmal atrial fibrillation only one episode after  bypass surgery treated with amiodarone now amiodarone has been withdrawn and he is doing well 4.  Essential hypertension blood pressure well controlled continue present management  We discussed healthy lifestyle need to exercise on a regular basis and good diet   COVID-19 Education: The signs and symptoms of COVID-19 were discussed with the patient and how to seek care for testing (follow up with PCP or arrange E-visit).  The importance of social distancing was discussed today.  Patient Risk:   After full review of this patients clinical status, I feel that they are at least moderate risk at this time.  Time:   Today, I have spent 18 minutes with the patient with telehealth technology discussing pt health issues.  I spent 5 minutes reviewing her chart before the visit.  Visit was finished at 2:45 PM.    Medication Adjustments/Labs and Tests Ordered: Current medicines are reviewed at length with the patient today.  Concerns regarding medicines are outlined above.  No orders of the defined types were placed in this encounter.  Medication changes: No orders of the defined types were placed in this encounter.    Disposition: Follow-up in 6 months  Signed, Park Liter, MD, [facility] 05/09/2019 2:45 PM    [place]

## 2019-05-16 ENCOUNTER — Other Ambulatory Visit: Payer: Self-pay | Admitting: Cardiology

## 2019-06-17 DIAGNOSIS — E785 Hyperlipidemia, unspecified: Secondary | ICD-10-CM | POA: Diagnosis not present

## 2019-06-17 DIAGNOSIS — E039 Hypothyroidism, unspecified: Secondary | ICD-10-CM | POA: Diagnosis not present

## 2019-06-17 DIAGNOSIS — E1165 Type 2 diabetes mellitus with hyperglycemia: Secondary | ICD-10-CM | POA: Diagnosis not present

## 2019-06-22 DIAGNOSIS — Z1331 Encounter for screening for depression: Secondary | ICD-10-CM | POA: Diagnosis not present

## 2019-06-22 DIAGNOSIS — Z6839 Body mass index (BMI) 39.0-39.9, adult: Secondary | ICD-10-CM | POA: Diagnosis not present

## 2019-06-22 DIAGNOSIS — E1165 Type 2 diabetes mellitus with hyperglycemia: Secondary | ICD-10-CM | POA: Diagnosis not present

## 2019-10-26 ENCOUNTER — Encounter: Payer: Self-pay | Admitting: Cardiology

## 2019-10-26 ENCOUNTER — Other Ambulatory Visit: Payer: Self-pay

## 2019-10-26 ENCOUNTER — Ambulatory Visit (INDEPENDENT_AMBULATORY_CARE_PROVIDER_SITE_OTHER): Payer: BC Managed Care – PPO | Admitting: Cardiology

## 2019-10-26 VITALS — BP 140/82 | HR 86 | Ht 69.0 in | Wt 269.6 lb

## 2019-10-26 DIAGNOSIS — E785 Hyperlipidemia, unspecified: Secondary | ICD-10-CM | POA: Diagnosis not present

## 2019-10-26 DIAGNOSIS — I2510 Atherosclerotic heart disease of native coronary artery without angina pectoris: Secondary | ICD-10-CM | POA: Diagnosis not present

## 2019-10-26 DIAGNOSIS — I10 Essential (primary) hypertension: Secondary | ICD-10-CM

## 2019-10-26 DIAGNOSIS — I480 Paroxysmal atrial fibrillation: Secondary | ICD-10-CM | POA: Diagnosis not present

## 2019-10-26 DIAGNOSIS — Z951 Presence of aortocoronary bypass graft: Secondary | ICD-10-CM | POA: Diagnosis not present

## 2019-10-26 NOTE — Patient Instructions (Signed)
Medication Instructions:  Your physician recommends that you continue on your current medications as directed. Please refer to the Current Medication list given to you today.  *If you need a refill on your cardiac medications before your next appointment, please call your pharmacy*  Lab Work: Your physician recommends that you return for lab work today: lipids    If you have labs (blood work) drawn today and your tests are completely normal, you will receive your results only by: . MyChart Message (if you have MyChart) OR . A paper copy in the mail If you have any lab test that is abnormal or we need to change your treatment, we will call you to review the results.  Testing/Procedures: None.    Follow-Up: At [facility], you and your health needs are our priority.  As part of our continuing mission to provide you with exceptional heart care, we have created designated Provider Care Teams.  These Care Teams include your primary Cardiologist (physician) and Advanced Practice Providers (APPs -  Physician Assistants and Nurse Practitioners) who all work together to provide you with the care you need, when you need it.  Your next appointment:   6 month(s)  The format for your next appointment:   In Person  Provider:   Robert Krasowski, MD  Other Instructions    

## 2019-10-26 NOTE — Progress Notes (Signed)
Cardiology Office Note:    Date:  10/26/2019   ID:  Nicholas Chambers, DOB 01/07/1961, MRN [id number]  PCP:  Angelina Sheriff, MD  Cardiologist:  Jenne Campus, MD    Referring MD: Angelina Sheriff, MD   Chief Complaint  Patient presents with  . Follow-up  Doing well  History of Present Illness:    Nicholas Chambers is a 58 y.o. male with past medical history significant for coronary artery disease status post coronary artery bypass graft x4- year ago.  Comes today to my office for follow-up.  Overall doing great.  He said he feels very well he is able to walk on a regular basis which he does he works walks about 2 to 3 miles every single day.  Denies have any chest pain, tightness, pressure, burning in the chest.  Overall doing very well.  Denies having any palpitations  Past Medical History:  Diagnosis Date  . Atrial fibrillation ([place]) 05/21/2018   Postoperative following CABG  . CAD (coronary artery disease) 06/03/2018  . CHEST PAIN-UNSPECIFIED 03/01/2009   Qualifier: Diagnosis of  By: Mare Ferrari, RMA, Sherri    . Diabetes mellitus ([place])   . Essential hypertension 03/01/2009   Qualifier: Diagnosis of  By: Mare Ferrari, [place], Sherri    . GERD 03/01/2009   Qualifier: Diagnosis of  By: Mare Ferrari, RMA, Sherri    . HYPERLIPIDEMIA-MIXED 03/01/2009   Qualifier: Diagnosis of  By: Mare Ferrari, RMA, Sherri    . Hypertension   . Hyperthyroidism   . HYPOTHYROIDISM 03/01/2009   Qualifier: Diagnosis of  By: Mare Ferrari, RMA, Sherri    . NSTEMI (non-ST elevated myocardial infarction) ([place]) 05/17/2018  . 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 Tingling in extremities 06/09/2014    Past Surgical History:  Procedure Laterality Date  . CORONARY ARTERY BYPASS GRAFT N/A 05/19/2018   Procedure: CORONARY ARTERY BYPASS GRAFTING (CABG) x 4 WITH ENDOSCOPIC HARVESTING OF RIGHT SAPHENOUS VEIN;  Surgeon: Gaye Pollack, MD;  Location: [facility];  Service: Open Heart Surgery;  Laterality: N/A;  . LEFT HEART CATH AND CORONARY  ANGIOGRAPHY N/A 05/18/2018   Procedure: LEFT HEART CATH AND CORONARY ANGIOGRAPHY;  Surgeon: Belva Crome, MD;  Location: [place] [facility];  Service: Cardiovascular;  Laterality: N/A;  . SHOULDER SURGERY     one on R, two on L  . TEE WITHOUT CARDIOVERSION N/A 05/19/2018   Procedure: TRANSESOPHAGEAL ECHOCARDIOGRAM (TEE);  Surgeon: Gaye Pollack, MD;  Location: [facility];  Service: Open Heart Surgery;  Laterality: N/A;  . UMBILICAL HERNIA REPAIR      Current Medications: Current Meds  Medication Sig  . amiodarone (PACERONE) 200 MG tablet Take 0.5 tablets (100 mg total) by mouth daily.  [name] aspirin EC 325 MG EC tablet Take 1 tablet (325 mg total) by mouth daily. (Patient taking differently: Take 162.5 mg by mouth daily. )  . atorvastatin (LIPITOR) 80 MG tablet Take 1 tablet (80 mg total) by mouth daily at 6 PM.  . Cyanocobalamin 1000 MCG/ML KIT Inject 1 mL as directed every 30 (thirty) days.  . empagliflozin (JARDIANCE) 25 MG TABS tablet Take 25 mg by mouth daily.  [name] esomeprazole (NEXIUM) 40 MG capsule Take 40 mg by mouth daily before breakfast.  . levothyroxine (SYNTHROID) 50 MCG tablet Take 50 mcg by mouth daily before breakfast.  . metoprolol tartrate (LOPRESSOR) 25 MG tablet TAKE ONE-HALF (1/2) TABLET TWICE A DAY     Allergies:   Crestor [rosuvastatin calcium], Lipitor ONEOK  calcium], Metformin and related, Niacin and related, and Viibryd [vilazodone hcl]   Social History   Socioeconomic History  . Marital status: Married    Spouse name: Not on file  . Number of children: Not on file  . Years of education: Not on file  . Highest education level: Not on file  Occupational History  . Not on file  Tobacco Use  . Smoking status: Never Smoker  . Smokeless tobacco: Never Used  Substance and Sexual Activity  . Alcohol use: Never  . Drug use: Never  . Sexual activity: Not Currently  Other Topics Concern  . Not on file  Social History Narrative  . Not on file   Social  Determinants of Health   Financial Resource Strain:   . Difficulty of Paying Living Expenses: Not on file  Food Insecurity:   . Worried About Charity fundraiser in the Last Year: Not on file  . Ran Out of Food in the Last Year: Not on file  Transportation Needs:   . Lack of Transportation (Medical): Not on file  . Lack of Transportation (Non-Medical): Not on file  Physical Activity:   . Days of Exercise per Week: Not on file  . Minutes of Exercise per Session: Not on file  Stress:   . Feeling of Stress : Not on file  Social Connections:   . Frequency of Communication with Friends and Family: Not on file  . Frequency of Social Gatherings with Friends and Family: Not on file  . Attends Religious Services: Not on file  . Active Member of Clubs or Organizations: Not on file  . Attends Archivist Meetings: Not on file  . Marital Status: Not on file     Family History: The patient's family history includes Diabetes in his brother. ROS:   Please see the history of present illness.    All 14 point review of systems negative except as described per history of present illness  EKGs/Labs/Other Studies Reviewed:      Recent Labs: No results found for requested labs within last 8760 hours.  Recent Lipid Panel    Component Value Date/Time   CHOL 240 (H) 05/18/2018 0028   TRIG 296 (H) 05/18/2018 0028   HDL 40 (L) 05/18/2018 0028   CHOLHDL 6.0 05/18/2018 0028   VLDL 59 (H) 05/18/2018 0028   LDLCALC 141 (H) 05/18/2018 0028    Physical Exam:    VS:  BP 140/82   Pulse 86   Ht 5' 9"  (1.753 m)   Wt 269 lb 9.6 oz (122.3 kg)   SpO2 97%   BMI 39.81 kg/m     Wt Readings from Last 3 Encounters:  10/26/19 269 lb 9.6 oz (122.3 kg)  05/09/19 260 lb (117.9 kg)  02/01/19 255 lb (115.7 kg)     GEN:  Well nourished, well developed in no acute distress HEENT: Normal NECK: No JVD; No carotid bruits LYMPHATICS: No lymphadenopathy CARDIAC: RRR, no murmurs, no rubs, no  gallops RESPIRATORY:  Clear to auscultation without rales, wheezing or rhonchi  ABDOMEN: Soft, non-tender, non-distended MUSCULOSKELETAL:  No edema; No deformity  SKIN: Warm and dry LOWER EXTREMITIES: no swelling NEUROLOGIC:  Alert and oriented x 3 PSYCHIATRIC:  Normal affect   ASSESSMENT:    1. Coronary artery disease involving native coronary artery of native heart without angina pectoris   2. S/P CABG x 4   3. Dyslipidemia   4. Paroxysmal atrial fibrillation (HCC)   5. Essential hypertension  PLAN:    In order of problems listed above:  1. Coronary disease stable from that point review doing well we will continue risk factors modifications. 2. Status post coronary artery bypass graft x4.  Noted.  Wound is healed completely. 3. Dyslipidemia I do have his fasting lipid profile from summer which is still unacceptably high.  His LDL is 102 need to be less than 70.  I will recheck his fasting lipid profile today.  And then he most likely required addition of Zetia and possibly PCSK9 agent. 4. Paroxysmal atrial fibrillation only episode after bypass surgery.  Does not have any right now.  Doing well from that point review we will continue present management. 5. Essential hypertension blood pressure controlled continue present management.  I encouraged him to exercise on the regular basis stick with good diet.  I see him back in my office in 6 months   Medication Adjustments/Labs and Tests Ordered: Current medicines are reviewed at length with the patient today.  Concerns regarding medicines are outlined above.  No orders of the defined types were placed in this encounter.  Medication changes: No orders of the defined types were placed in this encounter.   Signed, Park Liter, MD, [facility] 10/26/2019 9:36 AM    [place]

## 2019-10-27 LAB — LIPID PANEL
Chol/HDL Ratio: 4.7 ratio (ref 0.0–5.0)
Cholesterol, Total: 207 mg/dL — ABNORMAL HIGH (ref 100–199)
HDL: 44 mg/dL (ref >39)
LDL Chol Calc (NIH): 128 mg/dL — ABNORMAL HIGH (ref 0–99)
Triglycerides: 196 mg/dL — ABNORMAL HIGH (ref 0–149)
VLDL Cholesterol Cal: 35 mg/dL (ref 5–40)

## 2019-10-31 ENCOUNTER — Telehealth: Payer: Self-pay | Admitting: Emergency Medicine

## 2019-10-31 DIAGNOSIS — E785 Hyperlipidemia, unspecified: Secondary | ICD-10-CM

## 2019-10-31 NOTE — Telephone Encounter (Signed)
Left message for patient to return call regarding results  

## 2019-11-01 NOTE — Addendum Note (Signed)
Addended by: Ashok Norris on: 11/01/2019 11:18 AM   Modules accepted: Orders

## 2019-11-01 NOTE — Telephone Encounter (Signed)
Called patient informed him of lab results. Advised him we are putting in lipid clinic referral. Patient verbally understands. No further questions.

## 2019-11-02 ENCOUNTER — Telehealth: Payer: Self-pay | Admitting: *Deleted

## 2019-11-02 NOTE — Telephone Encounter (Signed)
A message was left, re: his new patient appointment. 

## 2019-11-21 ENCOUNTER — Telehealth: Payer: Self-pay

## 2019-11-21 NOTE — Telephone Encounter (Signed)
-----   Message from [place], [facility] sent at 11/20/2019  5:02 PM EST ----- Regarding: PharmD schedule opening Patient scheduled for Feb/2 - lipid clinic. We have opening in January. Please check if patient will like appt in January instead.

## 2019-11-21 NOTE — Telephone Encounter (Signed)
lmom to reshedule appt

## 2019-12-13 ENCOUNTER — Ambulatory Visit: Payer: BC Managed Care – PPO

## 2019-12-20 IMAGING — DX DG CHEST 1V PORT
1 series · 1 of 1 positions shown · non-contrast
Comparison: 05/17/2018

CLINICAL DATA: Coronary artery disease.  Nonsmoker.

EXAM:
PORTABLE CHEST 1 VIEW

[chest]
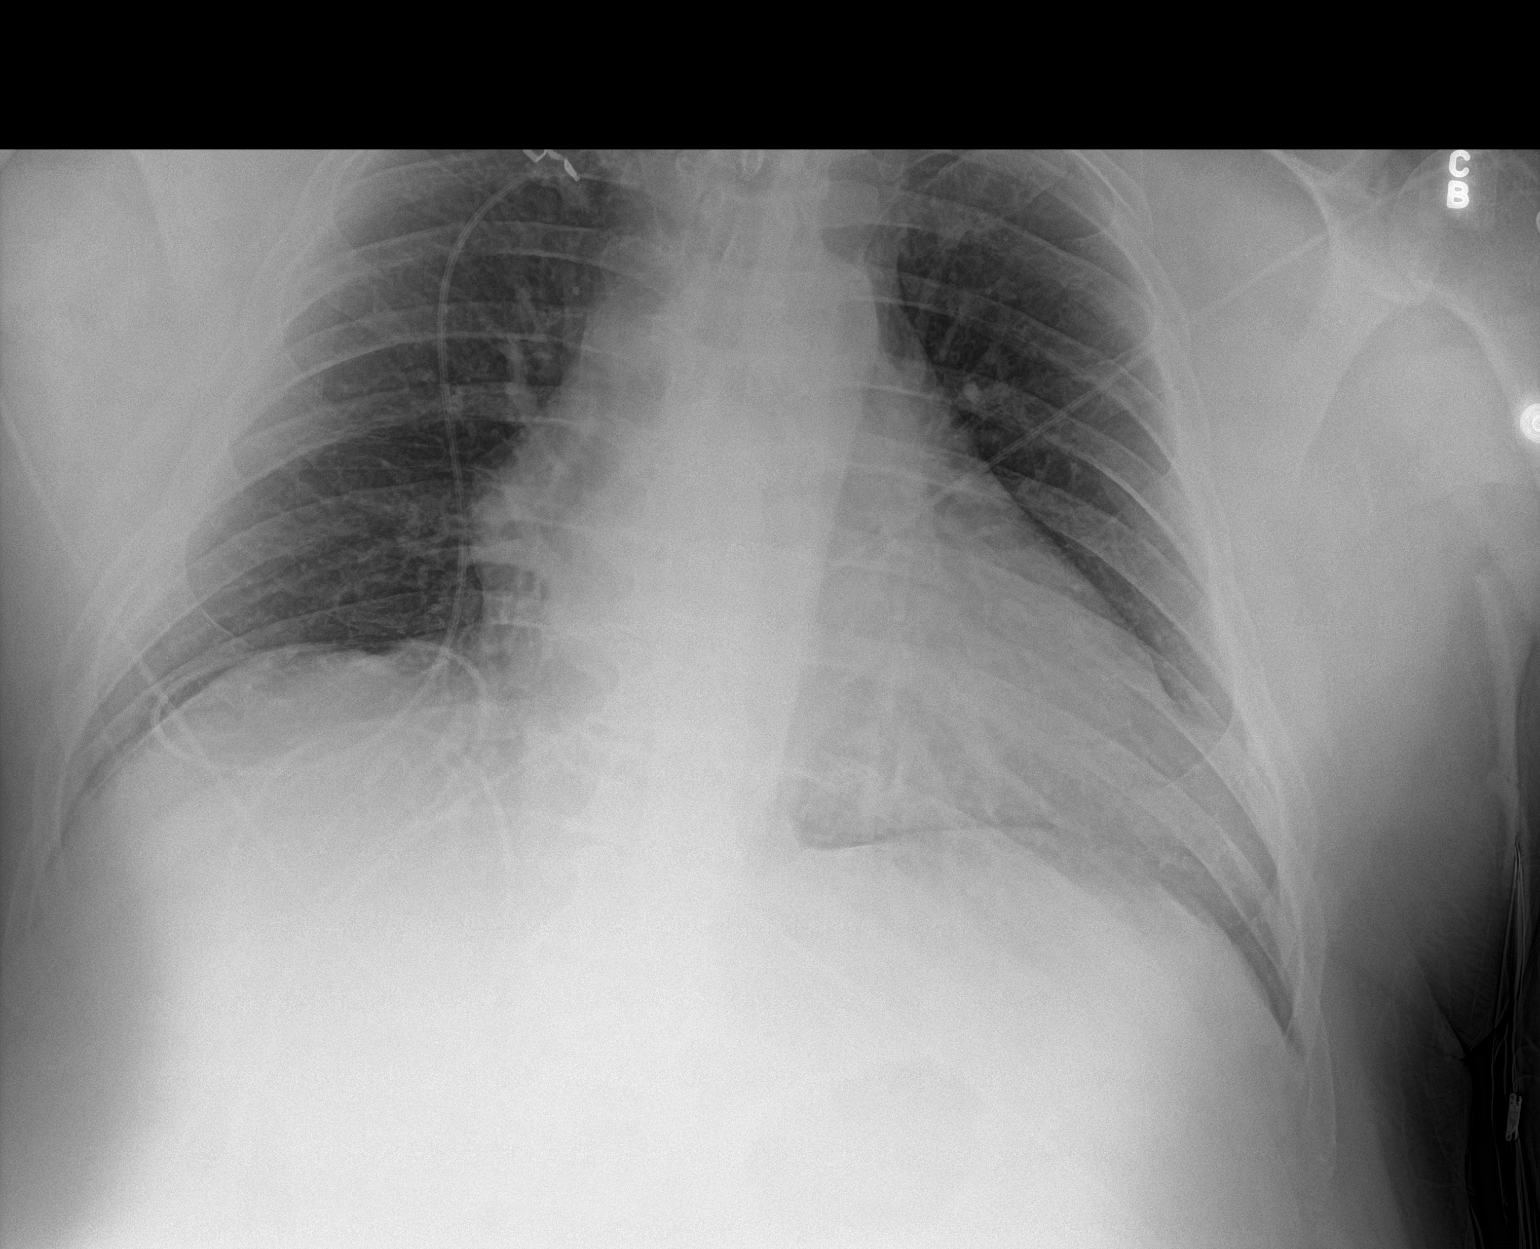

[1 of 1 positions shown; findings below may reference images not displayed]

FINDINGS: Stable cardiomegaly. Nonaneurysmal thoracic aorta. No acute
pulmonary consolidation or CHF. No effusion or pneumothorax. No
acute osseous abnormality.
IMPRESSION: No active disease.  Stable cardiomegaly.

## 2019-12-21 IMAGING — DX DG CHEST 1V PORT
1 series · 1 of 1 positions shown · non-contrast
Comparison: Portable exam 2773 hours compared to 05/18/2018

CLINICAL DATA: Four-vessel CABG, history hypertension, diabetes
mellitus, and STEMI

EXAM:
PORTABLE CHEST 1 VIEW

[chest ap]
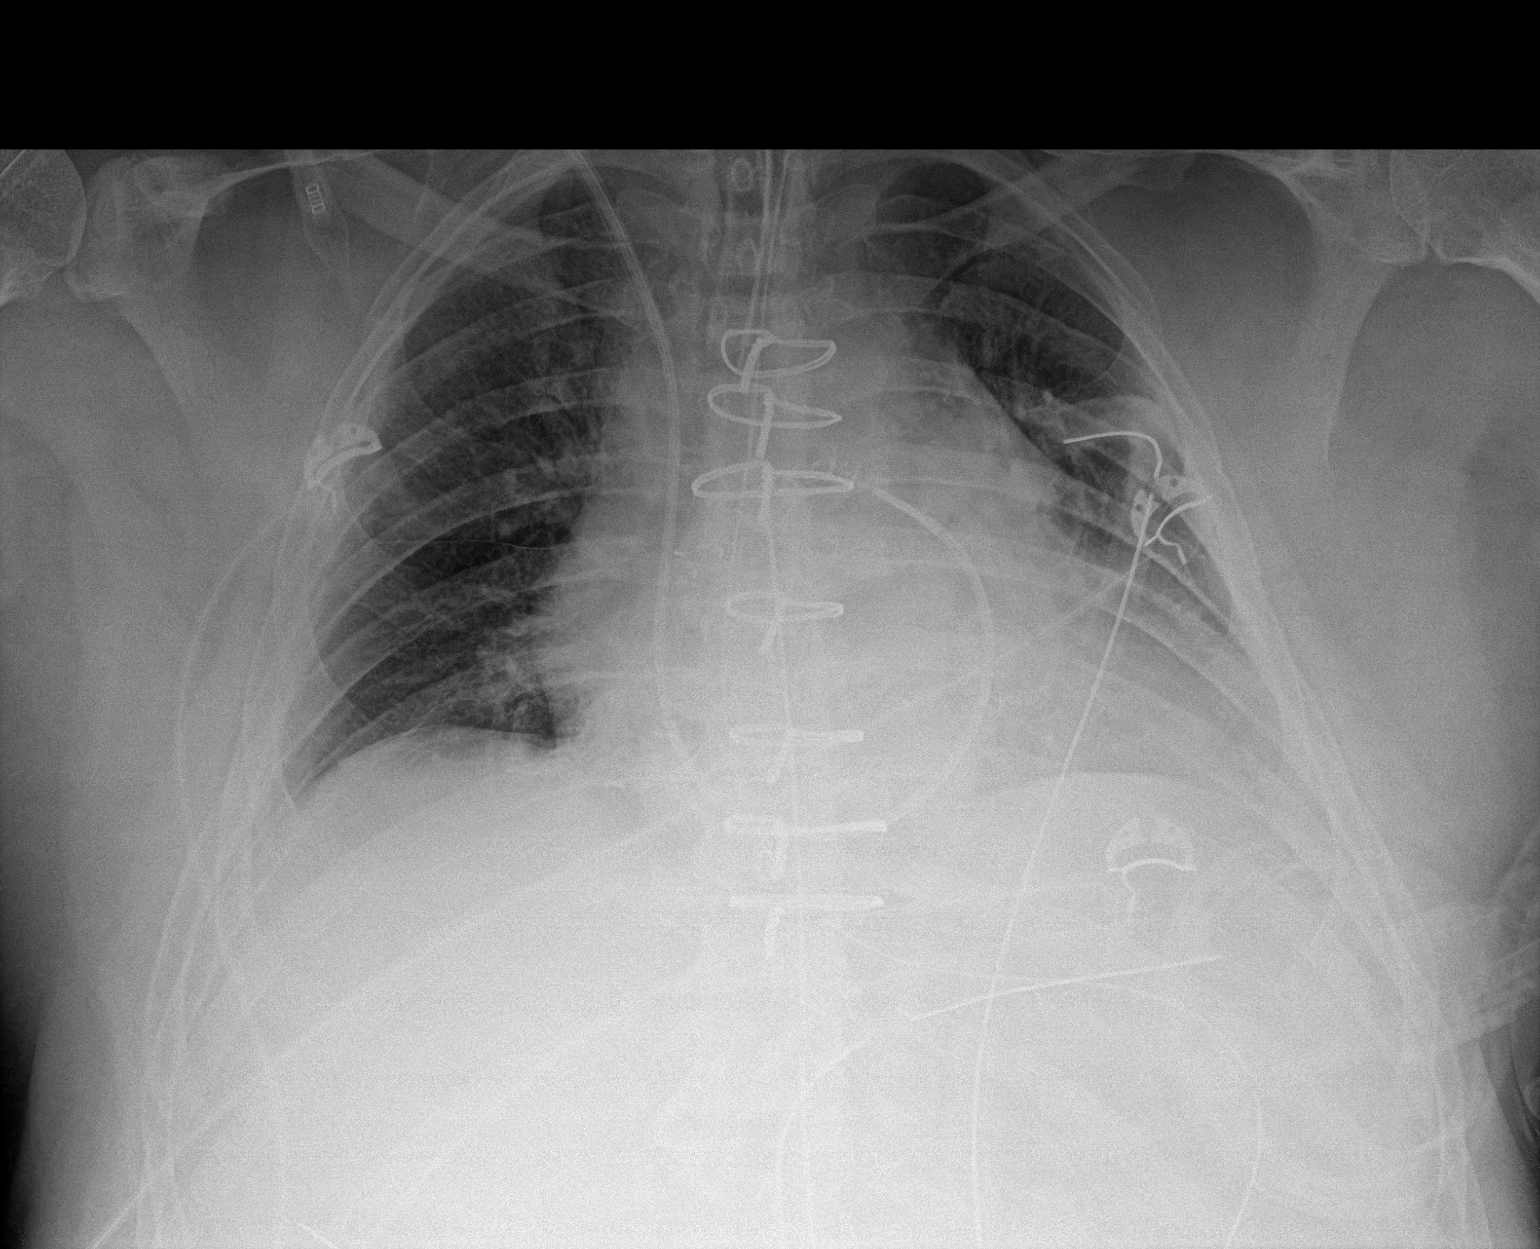

[1 of 1 positions shown; findings below may reference images not displayed]

FINDINGS: Tip of endotracheal tube projects 2.9 cm above carina.

Nasogastric tube extends into stomach.

RIGHT jugular Swan-Ganz catheter tip projects over proximal RIGHT
pulmonary artery.

Mediastinal drains and LEFT thoracostomy tube present.

Enlargement of cardiac silhouette and prominence of mediastinum post
CABG.

Bibasilar atelectasis.

No definite infiltrate, pleural effusion or pneumothorax.
IMPRESSION: Postsurgical changes as above.

## 2019-12-22 IMAGING — DX DG CHEST 1V PORT
1 series · 1 of 1 positions shown · non-contrast
Comparison: 05/19/2018.

CLINICAL DATA: Chest tube present.  CABG.

EXAM:
PORTABLE CHEST 1 VIEW

[chest ap]
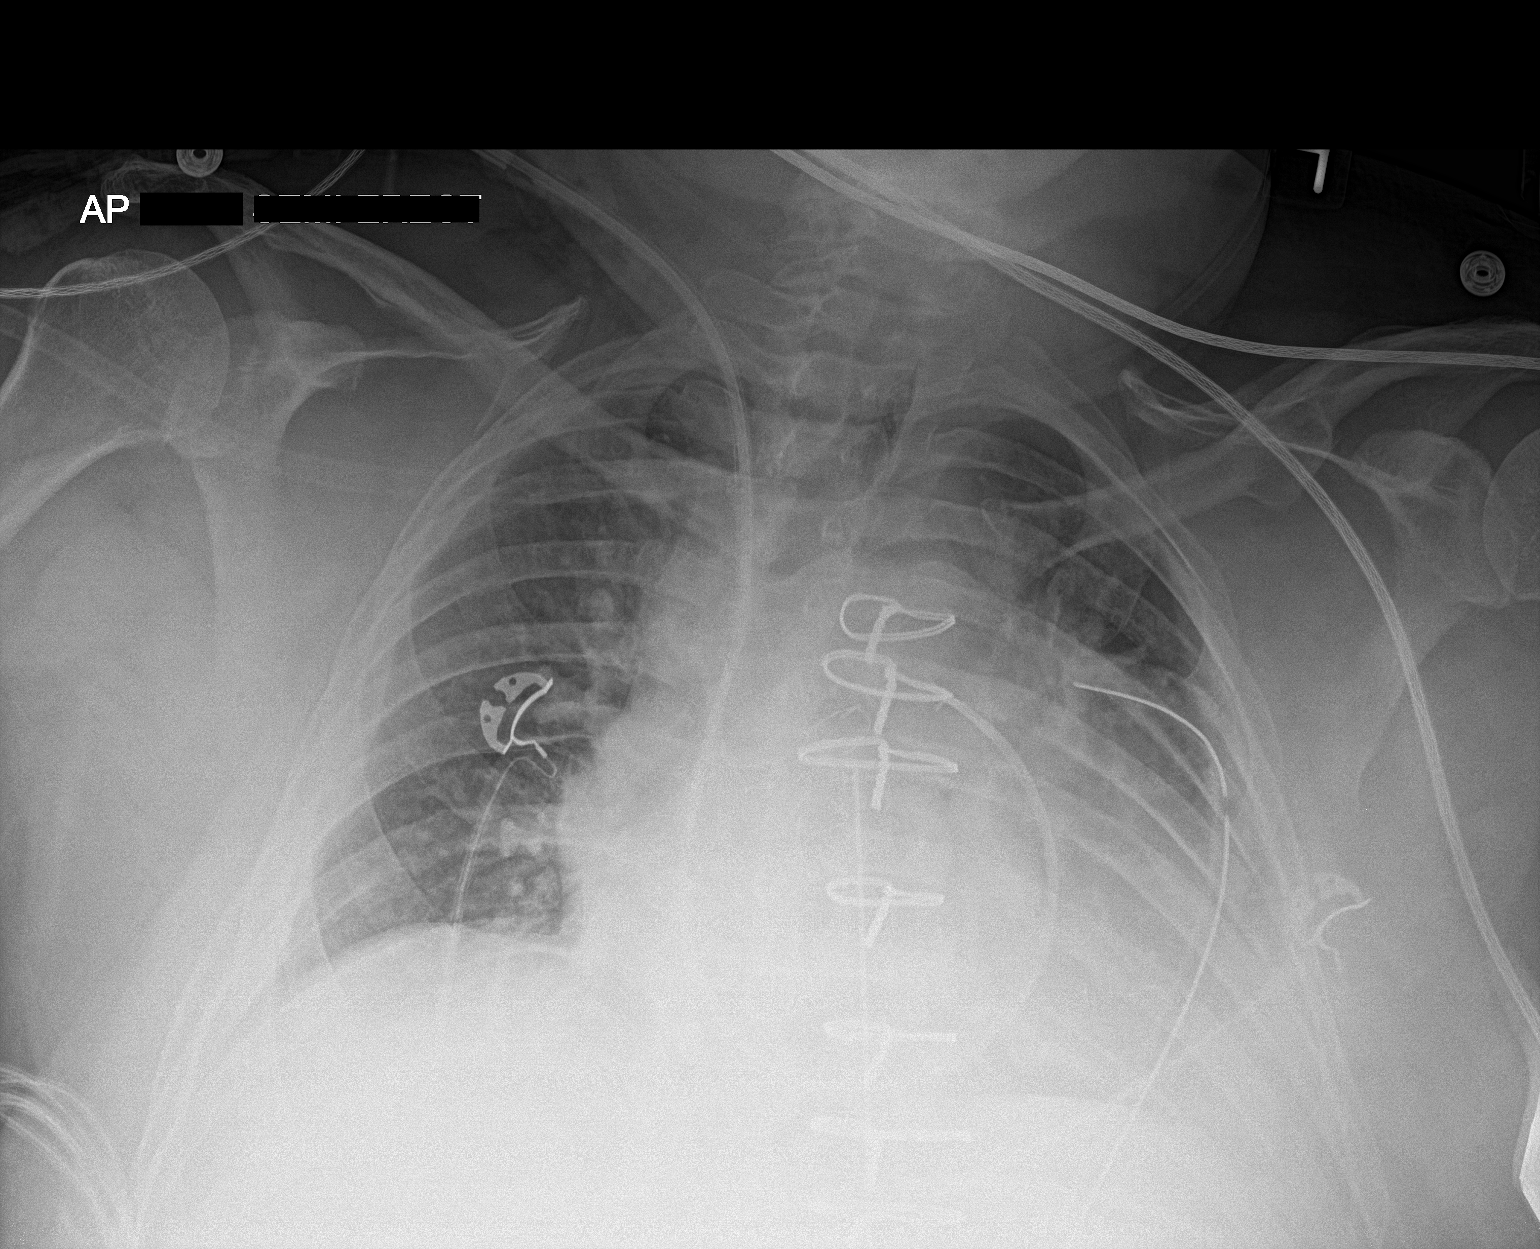

[1 of 1 positions shown; findings below may reference images not displayed]

FINDINGS: ET tube removed. Low lung volumes post extubation. LEFT chest tube
good position. Mediastinal tube good position. Swan-Ganz catheter
tip main PA. Mild vascular congestion.
IMPRESSION: Post extubation film.  Low lung volumes.  Vascular congestion.

## 2019-12-23 IMAGING — DX DG CHEST 1V PORT
1 series · 1 of 1 positions shown · non-contrast
Comparison: 05/20/2018

CLINICAL DATA: CABG short of breath

EXAM:
PORTABLE CHEST 1 VIEW

[chest ap]
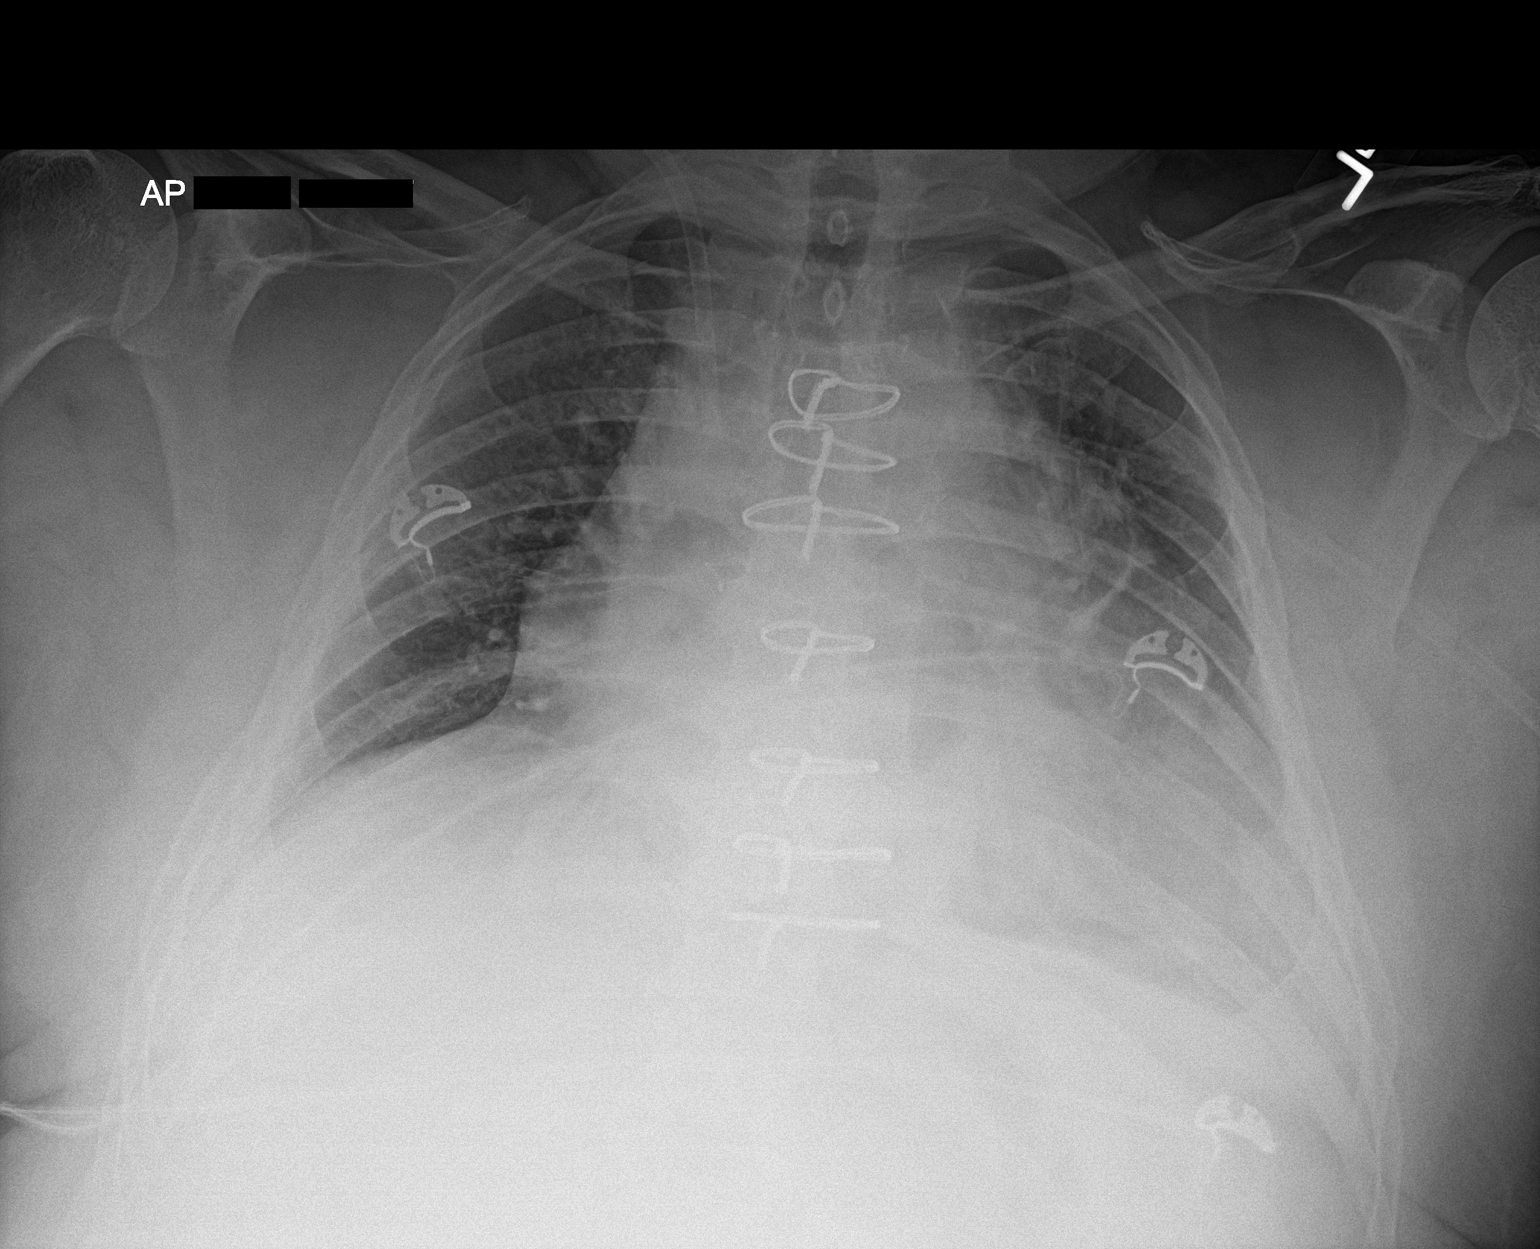

[1 of 1 positions shown; findings below may reference images not displayed]

FINDINGS: Swan-Ganz catheter removed. Right jugular sheath in place. Left
chest tube removed. Mediastinal drain removed.

Cardiac enlargement without edema. Mild bibasilar atelectasis. No
pneumothorax
IMPRESSION: Negative for pneumothorax post chest tube removal.

Mild bibasilar atelectasis without edema.

## 2019-12-26 ENCOUNTER — Other Ambulatory Visit: Payer: Self-pay | Admitting: Cardiology

## 2019-12-28 ENCOUNTER — Telehealth: Payer: Self-pay

## 2019-12-28 NOTE — Telephone Encounter (Signed)
LMOM TO RESCHEDULE 

## 2020-01-03 ENCOUNTER — Ambulatory Visit: Payer: BC Managed Care – PPO

## 2020-01-26 ENCOUNTER — Ambulatory Visit: Payer: BC Managed Care – PPO

## 2020-01-26 NOTE — Progress Notes (Deleted)
01/26/2020 Nicholas Chambers 02/13/1961 [id number]   HPI:  Nicholas Chambers is a 59 y.o. male patient of Dr Agustin Cree, who presents today for a lipid clinic evaluation.  See pertinent past medical history below.  Past Medical History: ASCVD S/p NSTEMI and CABG x 4 in 2019  Atrial fibrillation Post-op CABG  DM2 A1c 7.5 (06/2019)  hypothyroidism TSH 5.386 (06/2019)   Current Medications: atorvastatin 80 mg qd  Cholesterol Goals: LDL < 70   Intolerant/previously tried:  Family history:   Diet:   Exercise:    Labs:  10/2019:  TC 207, TG 196, HDL 44, LDL 128   Current Outpatient Medications  Medication Sig Dispense Refill  . aspirin EC 325 MG EC tablet Take 1 tablet (325 mg total) by mouth daily. (Patient taking differently: Take 162.5 mg by mouth daily. ) 30 tablet 0  . atorvastatin (LIPITOR) 80 MG tablet Take 1 tablet (80 mg total) by mouth daily at 6 PM. 30 tablet 1  . Cyanocobalamin 1000 MCG/ML KIT Inject 1 mL as directed every 30 (thirty) days.    . empagliflozin (JARDIANCE) 25 MG TABS tablet Take 25 mg by mouth daily.    [name] esomeprazole (NEXIUM) 40 MG capsule Take 40 mg by mouth daily before breakfast.    . levothyroxine (SYNTHROID) 50 MCG tablet Take 50 mcg by mouth daily before breakfast.    . metoprolol tartrate (LOPRESSOR) 25 MG tablet TAKE ONE-HALF (1/2) TABLET TWICE A DAY 90 tablet 0   No current facility-administered medications for this visit.    Allergies  Allergen Reactions  . Crestor [Rosuvastatin Calcium] Other (See Comments)    Malaise    . Lipitor [Atorvastatin Calcium] Other (See Comments)    malaise  . Metformin And Related Other (See Comments)    GI Intolerance   . Niacin And Related Other (See Comments)    Fatigue and chest pain  . Viibryd [employer] Hcl] Other (See Comments)    Hypomania     Past Medical History:  Diagnosis Date  . Atrial fibrillation ([place]) 05/21/2018   Postoperative following CABG  . CAD (coronary artery disease)  06/03/2018  . CHEST PAIN-UNSPECIFIED 03/01/2009   Qualifier: Diagnosis of  By: Mare Ferrari, RMA, Sherri    . Diabetes mellitus ([place])   . Essential hypertension 03/01/2009   Qualifier: Diagnosis of  By: Mare Ferrari, [place], Sherri    . GERD 03/01/2009   Qualifier: Diagnosis of  By: Mare Ferrari, RMA, Sherri    . HYPERLIPIDEMIA-MIXED 03/01/2009   Qualifier: Diagnosis of  By: Mare Ferrari, RMA, Sherri    . Hypertension   . Hyperthyroidism   . HYPOTHYROIDISM 03/01/2009   Qualifier: Diagnosis of  By: Mare Ferrari, RMA, Sherri    . NSTEMI (non-ST elevated myocardial infarction) ([place]) 05/17/2018  . 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 Tingling in extremities 06/09/2014    There were no vitals taken for this visit.   No problem-specific Assessment & Plan notes found for this encounter.   Tommy Medal PharmD CPP [facility] [address] [place], [place] [address] [phone]

## 2020-02-04 DIAGNOSIS — E785 Hyperlipidemia, unspecified: Secondary | ICD-10-CM | POA: Diagnosis not present

## 2020-02-04 DIAGNOSIS — M109 Gout, unspecified: Secondary | ICD-10-CM | POA: Diagnosis not present

## 2020-02-04 DIAGNOSIS — R079 Chest pain, unspecified: Secondary | ICD-10-CM | POA: Diagnosis not present

## 2020-02-04 DIAGNOSIS — I10 Essential (primary) hypertension: Secondary | ICD-10-CM | POA: Diagnosis not present

## 2020-02-04 DIAGNOSIS — Z951 Presence of aortocoronary bypass graft: Secondary | ICD-10-CM | POA: Diagnosis not present

## 2020-02-04 DIAGNOSIS — E1165 Type 2 diabetes mellitus with hyperglycemia: Secondary | ICD-10-CM | POA: Diagnosis not present

## 2020-02-04 DIAGNOSIS — K219 Gastro-esophageal reflux disease without esophagitis: Secondary | ICD-10-CM | POA: Diagnosis not present

## 2020-02-04 DIAGNOSIS — I2510 Atherosclerotic heart disease of native coronary artery without angina pectoris: Secondary | ICD-10-CM | POA: Diagnosis not present

## 2020-02-04 DIAGNOSIS — Z7982 Long term (current) use of aspirin: Secondary | ICD-10-CM | POA: Diagnosis not present

## 2020-02-04 DIAGNOSIS — Z79899 Other long term (current) drug therapy: Secondary | ICD-10-CM | POA: Diagnosis not present

## 2020-02-04 DIAGNOSIS — R0789 Other chest pain: Secondary | ICD-10-CM | POA: Diagnosis not present

## 2020-02-04 DIAGNOSIS — E039 Hypothyroidism, unspecified: Secondary | ICD-10-CM | POA: Diagnosis not present

## 2020-02-04 DIAGNOSIS — R072 Precordial pain: Secondary | ICD-10-CM | POA: Diagnosis not present

## 2020-02-04 DIAGNOSIS — R0600 Dyspnea, unspecified: Secondary | ICD-10-CM | POA: Diagnosis not present

## 2020-02-14 DIAGNOSIS — R109 Unspecified abdominal pain: Secondary | ICD-10-CM | POA: Diagnosis not present

## 2020-02-14 DIAGNOSIS — Z1331 Encounter for screening for depression: Secondary | ICD-10-CM | POA: Diagnosis not present

## 2020-02-14 DIAGNOSIS — Z6841 Body Mass Index (BMI) 40.0 and over, adult: Secondary | ICD-10-CM | POA: Diagnosis not present

## 2020-02-14 DIAGNOSIS — M109 Gout, unspecified: Secondary | ICD-10-CM | POA: Diagnosis not present

## 2020-02-14 DIAGNOSIS — E1165 Type 2 diabetes mellitus with hyperglycemia: Secondary | ICD-10-CM | POA: Diagnosis not present

## 2020-02-22 DIAGNOSIS — R109 Unspecified abdominal pain: Secondary | ICD-10-CM | POA: Diagnosis not present

## 2020-02-22 DIAGNOSIS — K760 Fatty (change of) liver, not elsewhere classified: Secondary | ICD-10-CM | POA: Diagnosis not present

## 2020-02-29 DIAGNOSIS — K828 Other specified diseases of gallbladder: Secondary | ICD-10-CM | POA: Diagnosis not present

## 2020-03-01 DIAGNOSIS — E1165 Type 2 diabetes mellitus with hyperglycemia: Secondary | ICD-10-CM | POA: Diagnosis not present

## 2020-03-01 DIAGNOSIS — E7800 Pure hypercholesterolemia, unspecified: Secondary | ICD-10-CM | POA: Diagnosis not present

## 2020-03-01 DIAGNOSIS — Z6839 Body mass index (BMI) 39.0-39.9, adult: Secondary | ICD-10-CM | POA: Diagnosis not present

## 2020-08-23 ENCOUNTER — Other Ambulatory Visit: Payer: Self-pay | Admitting: Cardiology

## 2020-09-10 DIAGNOSIS — K219 Gastro-esophageal reflux disease without esophagitis: Secondary | ICD-10-CM | POA: Diagnosis not present

## 2020-09-14 DIAGNOSIS — Z125 Encounter for screening for malignant neoplasm of prostate: Secondary | ICD-10-CM | POA: Diagnosis not present

## 2020-09-14 DIAGNOSIS — E1165 Type 2 diabetes mellitus with hyperglycemia: Secondary | ICD-10-CM | POA: Diagnosis not present

## 2020-09-14 DIAGNOSIS — E7800 Pure hypercholesterolemia, unspecified: Secondary | ICD-10-CM | POA: Diagnosis not present

## 2020-09-14 DIAGNOSIS — Z23 Encounter for immunization: Secondary | ICD-10-CM | POA: Diagnosis not present

## 2020-09-14 DIAGNOSIS — Z9119 Patient's noncompliance with other medical treatment and regimen: Secondary | ICD-10-CM | POA: Diagnosis not present

## 2020-09-19 DIAGNOSIS — Z1159 Encounter for screening for other viral diseases: Secondary | ICD-10-CM | POA: Diagnosis not present

## 2020-09-25 DIAGNOSIS — R079 Chest pain, unspecified: Secondary | ICD-10-CM | POA: Diagnosis not present

## 2020-09-25 DIAGNOSIS — K229 Disease of esophagus, unspecified: Secondary | ICD-10-CM | POA: Diagnosis not present

## 2020-09-25 DIAGNOSIS — K449 Diaphragmatic hernia without obstruction or gangrene: Secondary | ICD-10-CM | POA: Diagnosis not present

## 2020-09-25 DIAGNOSIS — R0789 Other chest pain: Secondary | ICD-10-CM | POA: Diagnosis not present

## 2020-09-25 DIAGNOSIS — I10 Essential (primary) hypertension: Secondary | ICD-10-CM | POA: Diagnosis not present

## 2020-09-25 DIAGNOSIS — K317 Polyp of stomach and duodenum: Secondary | ICD-10-CM | POA: Diagnosis not present

## 2020-09-25 DIAGNOSIS — E119 Type 2 diabetes mellitus without complications: Secondary | ICD-10-CM | POA: Diagnosis not present

## 2020-10-24 DIAGNOSIS — K219 Gastro-esophageal reflux disease without esophagitis: Secondary | ICD-10-CM | POA: Diagnosis not present

## 2020-12-27 DIAGNOSIS — K219 Gastro-esophageal reflux disease without esophagitis: Secondary | ICD-10-CM | POA: Diagnosis not present

## 2021-01-24 DIAGNOSIS — Z23 Encounter for immunization: Secondary | ICD-10-CM | POA: Diagnosis not present

## 2021-01-24 DIAGNOSIS — Z0000 Encounter for general adult medical examination without abnormal findings: Secondary | ICD-10-CM | POA: Diagnosis not present

## 2021-01-24 DIAGNOSIS — Z6839 Body mass index (BMI) 39.0-39.9, adult: Secondary | ICD-10-CM | POA: Diagnosis not present

## 2021-01-24 DIAGNOSIS — Z13220 Encounter for screening for lipoid disorders: Secondary | ICD-10-CM | POA: Diagnosis not present

## 2021-01-24 DIAGNOSIS — Z131 Encounter for screening for diabetes mellitus: Secondary | ICD-10-CM | POA: Diagnosis not present

## 2021-02-19 DIAGNOSIS — N400 Enlarged prostate without lower urinary tract symptoms: Secondary | ICD-10-CM | POA: Diagnosis not present

## 2021-02-19 DIAGNOSIS — Z6838 Body mass index (BMI) 38.0-38.9, adult: Secondary | ICD-10-CM | POA: Diagnosis not present

## 2021-02-19 DIAGNOSIS — N419 Inflammatory disease of prostate, unspecified: Secondary | ICD-10-CM | POA: Diagnosis not present

## 2021-03-25 DIAGNOSIS — J329 Chronic sinusitis, unspecified: Secondary | ICD-10-CM | POA: Diagnosis not present

## 2021-03-25 DIAGNOSIS — J40 Bronchitis, not specified as acute or chronic: Secondary | ICD-10-CM | POA: Diagnosis not present

## 2021-05-07 DIAGNOSIS — R1032 Left lower quadrant pain: Secondary | ICD-10-CM | POA: Diagnosis not present

## 2021-05-07 DIAGNOSIS — Z79899 Other long term (current) drug therapy: Secondary | ICD-10-CM | POA: Diagnosis not present

## 2021-05-07 DIAGNOSIS — N411 Chronic prostatitis: Secondary | ICD-10-CM | POA: Diagnosis not present

## 2021-05-07 DIAGNOSIS — R351 Nocturia: Secondary | ICD-10-CM | POA: Diagnosis not present

## 2021-05-07 DIAGNOSIS — N401 Enlarged prostate with lower urinary tract symptoms: Secondary | ICD-10-CM | POA: Diagnosis not present

## 2021-05-16 DIAGNOSIS — R109 Unspecified abdominal pain: Secondary | ICD-10-CM | POA: Diagnosis not present

## 2021-05-16 DIAGNOSIS — R1032 Left lower quadrant pain: Secondary | ICD-10-CM | POA: Diagnosis not present

## 2021-05-31 DIAGNOSIS — E7800 Pure hypercholesterolemia, unspecified: Secondary | ICD-10-CM | POA: Diagnosis not present

## 2021-05-31 DIAGNOSIS — E1121 Type 2 diabetes mellitus with diabetic nephropathy: Secondary | ICD-10-CM | POA: Diagnosis not present

## 2021-06-10 DIAGNOSIS — Z1331 Encounter for screening for depression: Secondary | ICD-10-CM | POA: Diagnosis not present

## 2021-06-10 DIAGNOSIS — Z6837 Body mass index (BMI) 37.0-37.9, adult: Secondary | ICD-10-CM | POA: Diagnosis not present

## 2021-06-10 DIAGNOSIS — E7800 Pure hypercholesterolemia, unspecified: Secondary | ICD-10-CM | POA: Diagnosis not present

## 2021-06-18 DIAGNOSIS — N411 Chronic prostatitis: Secondary | ICD-10-CM | POA: Diagnosis not present

## 2021-06-18 DIAGNOSIS — R109 Unspecified abdominal pain: Secondary | ICD-10-CM | POA: Diagnosis not present

## 2021-06-18 DIAGNOSIS — N401 Enlarged prostate with lower urinary tract symptoms: Secondary | ICD-10-CM | POA: Diagnosis not present

## 2021-06-18 DIAGNOSIS — R351 Nocturia: Secondary | ICD-10-CM | POA: Diagnosis not present

## 2021-08-13 DIAGNOSIS — E785 Hyperlipidemia, unspecified: Secondary | ICD-10-CM | POA: Diagnosis not present

## 2021-08-13 DIAGNOSIS — E1169 Type 2 diabetes mellitus with other specified complication: Secondary | ICD-10-CM | POA: Diagnosis not present

## 2021-08-13 DIAGNOSIS — Z7689 Persons encountering health services in other specified circumstances: Secondary | ICD-10-CM | POA: Diagnosis not present

## 2021-08-13 DIAGNOSIS — R202 Paresthesia of skin: Secondary | ICD-10-CM | POA: Diagnosis not present

## 2021-08-13 DIAGNOSIS — Z6838 Body mass index (BMI) 38.0-38.9, adult: Secondary | ICD-10-CM | POA: Diagnosis not present

## 2021-08-14 DIAGNOSIS — E538 Deficiency of other specified B group vitamins: Secondary | ICD-10-CM | POA: Diagnosis not present

## 2021-08-14 DIAGNOSIS — E1169 Type 2 diabetes mellitus with other specified complication: Secondary | ICD-10-CM | POA: Diagnosis not present

## 2021-08-14 DIAGNOSIS — E039 Hypothyroidism, unspecified: Secondary | ICD-10-CM | POA: Diagnosis not present

## 2021-08-21 DIAGNOSIS — Z23 Encounter for immunization: Secondary | ICD-10-CM | POA: Diagnosis not present

## 2021-08-21 DIAGNOSIS — E1169 Type 2 diabetes mellitus with other specified complication: Secondary | ICD-10-CM | POA: Diagnosis not present

## 2021-08-21 DIAGNOSIS — E785 Hyperlipidemia, unspecified: Secondary | ICD-10-CM | POA: Diagnosis not present

## 2021-08-21 DIAGNOSIS — E039 Hypothyroidism, unspecified: Secondary | ICD-10-CM | POA: Diagnosis not present

## 2021-08-29 DIAGNOSIS — Z23 Encounter for immunization: Secondary | ICD-10-CM | POA: Diagnosis not present

## 2021-09-04 DIAGNOSIS — I10 Essential (primary) hypertension: Secondary | ICD-10-CM | POA: Diagnosis not present

## 2021-09-25 ENCOUNTER — Other Ambulatory Visit: Payer: Self-pay | Admitting: Cardiology

## 2021-10-30 ENCOUNTER — Other Ambulatory Visit: Payer: Self-pay | Admitting: Cardiology

## 2021-10-31 NOTE — Telephone Encounter (Signed)
Rx refill sent to pharmacy.  2nd attempt for pt to make appt.

## 2021-11-26 DIAGNOSIS — E039 Hypothyroidism, unspecified: Secondary | ICD-10-CM | POA: Diagnosis not present

## 2021-11-26 DIAGNOSIS — Z125 Encounter for screening for malignant neoplasm of prostate: Secondary | ICD-10-CM | POA: Diagnosis not present

## 2021-11-26 DIAGNOSIS — E1169 Type 2 diabetes mellitus with other specified complication: Secondary | ICD-10-CM | POA: Diagnosis not present

## 2021-12-04 DIAGNOSIS — E1169 Type 2 diabetes mellitus with other specified complication: Secondary | ICD-10-CM | POA: Diagnosis not present

## 2021-12-04 DIAGNOSIS — R079 Chest pain, unspecified: Secondary | ICD-10-CM | POA: Diagnosis not present

## 2021-12-04 DIAGNOSIS — E785 Hyperlipidemia, unspecified: Secondary | ICD-10-CM | POA: Diagnosis not present

## 2021-12-04 DIAGNOSIS — E039 Hypothyroidism, unspecified: Secondary | ICD-10-CM | POA: Diagnosis not present

## 2021-12-27 DIAGNOSIS — R079 Chest pain, unspecified: Secondary | ICD-10-CM | POA: Diagnosis not present

## 2021-12-27 DIAGNOSIS — E785 Hyperlipidemia, unspecified: Secondary | ICD-10-CM | POA: Diagnosis not present

## 2021-12-27 DIAGNOSIS — Z6837 Body mass index (BMI) 37.0-37.9, adult: Secondary | ICD-10-CM | POA: Diagnosis not present

## 2022-01-03 ENCOUNTER — Other Ambulatory Visit: Payer: Self-pay | Admitting: Cardiology

## 2022-03-22 ENCOUNTER — Encounter: Payer: Self-pay | Admitting: Cardiology

## 2022-03-22 DIAGNOSIS — Z79899 Other long term (current) drug therapy: Secondary | ICD-10-CM | POA: Diagnosis not present

## 2022-03-22 DIAGNOSIS — T161XXA Foreign body in right ear, initial encounter: Secondary | ICD-10-CM | POA: Diagnosis not present

## 2022-03-22 DIAGNOSIS — Z951 Presence of aortocoronary bypass graft: Secondary | ICD-10-CM | POA: Diagnosis not present

## 2022-03-22 DIAGNOSIS — E860 Dehydration: Secondary | ICD-10-CM | POA: Diagnosis not present

## 2022-03-22 DIAGNOSIS — E039 Hypothyroidism, unspecified: Secondary | ICD-10-CM | POA: Diagnosis not present

## 2022-03-22 DIAGNOSIS — Z7984 Long term (current) use of oral hypoglycemic drugs: Secondary | ICD-10-CM | POA: Diagnosis not present

## 2022-03-22 DIAGNOSIS — I340 Nonrheumatic mitral (valve) insufficiency: Secondary | ICD-10-CM | POA: Diagnosis not present

## 2022-03-22 DIAGNOSIS — E7800 Pure hypercholesterolemia, unspecified: Secondary | ICD-10-CM | POA: Diagnosis not present

## 2022-03-22 DIAGNOSIS — R079 Chest pain, unspecified: Secondary | ICD-10-CM | POA: Diagnosis not present

## 2022-03-22 DIAGNOSIS — I4891 Unspecified atrial fibrillation: Secondary | ICD-10-CM | POA: Diagnosis not present

## 2022-03-22 DIAGNOSIS — M109 Gout, unspecified: Secondary | ICD-10-CM | POA: Diagnosis not present

## 2022-03-22 DIAGNOSIS — I25709 Atherosclerosis of coronary artery bypass graft(s), unspecified, with unspecified angina pectoris: Secondary | ICD-10-CM | POA: Diagnosis not present

## 2022-03-22 DIAGNOSIS — Z7982 Long term (current) use of aspirin: Secondary | ICD-10-CM | POA: Diagnosis not present

## 2022-03-22 DIAGNOSIS — E119 Type 2 diabetes mellitus without complications: Secondary | ICD-10-CM | POA: Diagnosis not present

## 2022-03-22 DIAGNOSIS — R0602 Shortness of breath: Secondary | ICD-10-CM | POA: Diagnosis not present

## 2022-03-22 DIAGNOSIS — K219 Gastro-esophageal reflux disease without esophagitis: Secondary | ICD-10-CM | POA: Diagnosis not present

## 2022-03-22 DIAGNOSIS — E872 Acidosis, unspecified: Secondary | ICD-10-CM | POA: Diagnosis not present

## 2022-03-22 DIAGNOSIS — I10 Essential (primary) hypertension: Secondary | ICD-10-CM | POA: Diagnosis not present

## 2022-03-22 DIAGNOSIS — M6282 Rhabdomyolysis: Secondary | ICD-10-CM | POA: Diagnosis not present

## 2022-03-22 DIAGNOSIS — R110 Nausea: Secondary | ICD-10-CM | POA: Diagnosis not present

## 2022-03-26 DIAGNOSIS — I4891 Unspecified atrial fibrillation: Secondary | ICD-10-CM | POA: Diagnosis not present

## 2022-04-25 ENCOUNTER — Ambulatory Visit: Payer: BC Managed Care – PPO | Admitting: Cardiology

## 2022-04-30 DIAGNOSIS — E039 Hypothyroidism, unspecified: Secondary | ICD-10-CM | POA: Diagnosis not present

## 2022-04-30 DIAGNOSIS — E1169 Type 2 diabetes mellitus with other specified complication: Secondary | ICD-10-CM | POA: Diagnosis not present

## 2022-05-05 DIAGNOSIS — Z6832 Body mass index (BMI) 32.0-32.9, adult: Secondary | ICD-10-CM | POA: Diagnosis not present

## 2022-05-05 DIAGNOSIS — E785 Hyperlipidemia, unspecified: Secondary | ICD-10-CM | POA: Diagnosis not present

## 2022-05-05 DIAGNOSIS — E1159 Type 2 diabetes mellitus with other circulatory complications: Secondary | ICD-10-CM | POA: Diagnosis not present

## 2022-05-05 DIAGNOSIS — I2510 Atherosclerotic heart disease of native coronary artery without angina pectoris: Secondary | ICD-10-CM | POA: Diagnosis not present

## 2022-05-05 DIAGNOSIS — I4891 Unspecified atrial fibrillation: Secondary | ICD-10-CM | POA: Diagnosis not present

## 2022-06-09 ENCOUNTER — Ambulatory Visit: Payer: BC Managed Care – PPO | Admitting: Cardiology

## 2022-06-09 ENCOUNTER — Encounter: Payer: Self-pay | Admitting: Cardiology

## 2022-06-09 VITALS — BP 124/80 | HR 72 | Ht 69.0 in | Wt 213.0 lb

## 2022-06-09 DIAGNOSIS — I10 Essential (primary) hypertension: Secondary | ICD-10-CM | POA: Diagnosis not present

## 2022-06-09 DIAGNOSIS — Z951 Presence of aortocoronary bypass graft: Secondary | ICD-10-CM

## 2022-06-09 DIAGNOSIS — I480 Paroxysmal atrial fibrillation: Secondary | ICD-10-CM | POA: Diagnosis not present

## 2022-06-09 MED ORDER — EZETIMIBE 10 MG PO TABS: 10.0000 mg | ORAL_TABLET | Freq: Every day | ORAL | 3 refills | Status: DC

## 2022-06-09 MED ORDER — DILTIAZEM HCL 30 MG PO TABS: ORAL_TABLET | ORAL | 6 refills | Status: DC

## 2022-06-09 NOTE — Patient Instructions (Addendum)
Medication Instructions:  Your physician has recommended you make the following change in your medication:   START: Cardizem 30mg  every 6 hours as needed for Tachycardia  START: Zetia 10mg  1 tablet daily by mouth    Lab Work: Your physician recommends that you return for lab work in: 6 weeks You need to have labs done when you are fasting.  You can come Monday through Friday 8:30 am to 12:00 pm and 1:15 to 4:30. You do not need to make an appointment as the order has already been placed. The labs you are going to have done are AST, ALT and Lipids.    Testing/Procedures: None Ordered   Follow-Up: At [facility], you and your health needs are our priority.  As part of our continuing mission to provide you with exceptional heart care, we have created designated Provider Care Teams.  These Care Teams include your primary Cardiologist (physician) and Advanced Practice Providers (APPs -  Physician Assistants and Nurse Practitioners) who all work together to provide you with the care you need, when you need it.  We recommend signing up for the patient portal called "MyChart".  Sign up information is provided on this After Visit Summary.  MyChart is used to connect with patients for Virtual Visits (Telemedicine).  Patients are able to view lab/test results, encounter notes, upcoming appointments, etc.  Non-urgent messages can be sent to your provider as well.   To learn more about what you can do with MyChart, go to Friday.    Your next appointment:   6 month(s)  The format for your next appointment:   In Person  Provider:   [facility], MD    Other Instructions NA

## 2022-06-09 NOTE — Progress Notes (Signed)
Cardiology Office Note:    Date:  06/09/2022   ID:  Nicholas Chambers, DOB Feb 17, 1961, MRN [id number]  PCP:  Nicholas Sheriff, MD  Cardiologist:  Nicholas Campus, MD    Referring MD: Nicholas Sheriff, MD   Chief Complaint  Patient presents with   Hospitalization Follow-up    History of Present Illness:    Nicholas Chambers is a 62 y.o. male with past medical history significant for coronary artery disease, status post coronary artery bypass grafting 2019, he was seen last time in my office in 2020.  Also essential hypertension, dyslipidemia.  He was doing great he exercised on the regular basis he walks with no difficulties recently in May he ended up being in the hospital because of episode of atrial fibrillation.  Echocardiogram showed preserved ejection fraction mildly enlarged left atrium, because of hypertension diabetes and coronary artery disease and CHADS2 Vascor equals 3 he was put on anticoagulation.  He is coming today doing well walks every single day with no difficulties.  There is no cardiac complaints otherwise.  Denies having episode of atrial fibrillation.  He did have 1 episode of atrial fibrillation after bypass surgery this is the second however unprovoked episode of atrial fibrillation.  He tells me he was walking outside.  It was very that he was walking with a sledgehammer ended up became dizzy and was found to be in atrial fibrillation.  Past Medical History:  Diagnosis Date   Atrial fibrillation ([place]) 05/21/2018   Postoperative following CABG   CAD (coronary artery disease) 06/03/2018   CHEST PAIN-UNSPECIFIED 03/01/2009   Qualifier: Diagnosis of  By: Nicholas Chambers, RMA, Nicholas Chambers     Diabetes mellitus ([place])    Essential hypertension 03/01/2009   Qualifier: Diagnosis of  By: Nicholas Chambers, RMA, Nicholas Chambers     GERD 03/01/2009   Qualifier: Diagnosis of  By: Nicholas Chambers, [place], Nicholas Chambers     HYPERLIPIDEMIA-MIXED 03/01/2009   Qualifier: Diagnosis of  By: Nicholas Chambers, RMA, Nicholas Chambers      Hypertension    Hyperthyroidism    HYPOTHYROIDISM 03/01/2009   Qualifier: Diagnosis of  By: Nicholas Chambers, RMA, Nicholas Chambers     NSTEMI (non-ST elevated myocardial infarction) ([place]) 05/17/2018   S/P CABG x 4 05/19/2018   Tingling in extremities 06/09/2014    Past Surgical History:  Procedure Laterality Date   CORONARY ARTERY BYPASS GRAFT N/A 05/19/2018   Procedure: CORONARY ARTERY BYPASS GRAFTING (CABG) x 4 WITH ENDOSCOPIC HARVESTING OF RIGHT SAPHENOUS VEIN;  Surgeon: Nicholas Pollack, MD;  Location: [place];  Service: Open Heart Surgery;  Laterality: N/A;   LEFT HEART CATH AND CORONARY ANGIOGRAPHY N/A 05/18/2018   Procedure: LEFT HEART CATH AND CORONARY ANGIOGRAPHY;  Surgeon: Nicholas Crome, MD;  Location: [place] [facility];  Service: Cardiovascular;  Laterality: N/A;   SHOULDER SURGERY     one on R, two on L   TEE WITHOUT CARDIOVERSION N/A 05/19/2018   Procedure: TRANSESOPHAGEAL ECHOCARDIOGRAM (TEE);  Surgeon: Nicholas Pollack, MD;  Location: [place];  Service: Open Heart Surgery;  Laterality: N/A;   UMBILICAL HERNIA REPAIR      Current Medications: Current Meds  Medication Sig   aspirin EC 81 MG tablet Take 81 mg by mouth daily. Swallow whole.   Cyanocobalamin 1000 MCG/ML KIT Inject 1 mL as directed every 30 (thirty) days.   ELIQUIS 5 MG TABS tablet Take 5 mg by mouth 2 (two) times daily.   empagliflozin (JARDIANCE) 25 MG TABS tablet Take 25 mg by  mouth daily.   esomeprazole (NEXIUM) 40 MG capsule Take 40 mg by mouth daily before breakfast.   gabapentin (NEURONTIN) 300 MG capsule Take 300 mg by mouth 3 (three) times daily.   levothyroxine (SYNTHROID) 100 MCG tablet Take 100 mcg by mouth daily before breakfast.   metoprolol succinate (TOPROL-XL) 25 MG 24 hr tablet Take 12.5 mg by mouth 2 (two) times daily.   olmesartan (BENICAR) 20 MG tablet Take 20 mg by mouth daily.   OZEMPIC, 1 MG/DOSE, 4 MG/3ML SOPN Inject 1 mg into the skin once a week.   REPATHA SURECLICK 258 MG/ML SOAJ Inject 1 mL into the skin  every 14 (fourteen) days.   sildenafil (VIAGRA) 50 MG tablet Take 50 mg by mouth daily as needed for erectile dysfunction.     Allergies:   Nicholas Chambers [rosuvastatin calcium], Nicholas Chambers [atorvastatin calcium], Nicholas Chambers and related, Nicholas Chambers and related, and Nicholas Chambers [vilazodone hcl]   Social History   Socioeconomic History   Marital status: Married    Spouse name: Not on file   Number of children: Not on file   Years of education: Not on file   Highest education level: Not on file  Occupational History   Not on file  Tobacco Use   Smoking status: Never   Smokeless tobacco: Never  Vaping Use   Vaping Use: Never used  Substance and Sexual Activity   Alcohol use: Never   Drug use: Never   Sexual activity: Not Currently  Other Topics Concern   Not on file  Social History Narrative   Not on file   Social Determinants of Health   Financial Resource Strain: Not on file  Food Insecurity: Not on file  Transportation Needs: Not on file  Physical Activity: Not on file  Stress: Not on file  Social Connections: Not on file     Family History: The patient's family history includes Diabetes in his brother. ROS:   Please see the history of present illness.    All 14 point review of systems negative except as described per history of present illness  EKGs/Labs/Other Studies Reviewed:      Recent Labs: No results found for requested labs within last 365 days.  Recent Lipid Panel    Component Value Date/Time   CHOL 207 (H) 10/26/2019 0946   TRIG 196 (H) 10/26/2019 0946   HDL 44 10/26/2019 0946   CHOLHDL 4.7 10/26/2019 0946   CHOLHDL 6.0 05/18/2018 0028   VLDL 59 (H) 05/18/2018 0028   LDLCALC 128 (H) 10/26/2019 0946    Physical Exam:    VS:  BP 124/80 (BP Location: Left Arm, Patient Position: Sitting, Cuff Size: Normal)   Pulse 72   Ht _0  (1.753 m)   Wt 213 lb (96.6 kg)   SpO2 99%   BMI 31.45 kg/m     Wt Readings from Last 3 Encounters:  06/09/22 213 lb (96.6 kg)   10/26/19 269 lb 9.6 oz (122.3 kg)  05/09/19 260 lb (117.9 kg)     GEN:  Well nourished, well developed in no acute distress HEENT: Normal NECK: No JVD; No carotid bruits LYMPHATICS: No lymphadenopathy CARDIAC: RRR, no murmurs, no rubs, no gallops RESPIRATORY:  Clear to auscultation without rales, wheezing or rhonchi  ABDOMEN: Soft, non-tender, non-distended MUSCULOSKELETAL:  No edema; No deformity  SKIN: Warm and dry LOWER EXTREMITIES: no swelling NEUROLOGIC:  Alert and oriented x 3 PSYCHIATRIC:  Normal affect   ASSESSMENT:    1. S/P CABG x 4  2. Essential hypertension   3. Paroxysmal atrial fibrillation (HCC)    PLAN:    In order of problems listed above:  Coronary disease stable from that point review and appropriate medication will continue.  He is on appropriate medication which I will continue Paroxysmal atrial fibrillation this is a second documented episode anticoagulated not antiarrhythmic therapy.  I will give him prescription for Cardizem 30 take as needed for tachycardia asked him to let me know if you have another episode of atrial fibrillation.  I told him to go to hospital if he is dizzy passing out or have episodes of chest pain or shortness of breath or sweating or simply does not feel well Essential hypertension blood pressure seems to be well controlled.  Dyslipidemia I did review K PN I have data from June of this year LDL 108 HDL 38.  We will add Zetia to his medical regimen.  He is on Repatha.  I did review record from hospital for this visit   Medication Adjustments/Labs and Tests Ordered: Current medicines are reviewed at length with the patient today.  Concerns regarding medicines are outlined above.  No orders of the defined types were placed in this encounter.  Medication changes: No orders of the defined types were placed in this encounter.   Signed, Park Liter, MD, [facility] 06/09/2022 9:41 AM    [place]

## 2022-06-09 NOTE — Addendum Note (Signed)
Addended by: Baldo Ash D on: 06/09/2022 09:54 AM   Modules accepted: Orders

## 2022-07-28 DIAGNOSIS — Z951 Presence of aortocoronary bypass graft: Secondary | ICD-10-CM | POA: Diagnosis not present

## 2022-07-28 DIAGNOSIS — I480 Paroxysmal atrial fibrillation: Secondary | ICD-10-CM | POA: Diagnosis not present

## 2022-07-28 DIAGNOSIS — I10 Essential (primary) hypertension: Secondary | ICD-10-CM | POA: Diagnosis not present

## 2022-07-29 LAB — ALT: ALT: 22 IU/L (ref 0–44)

## 2022-07-29 LAB — LIPID PANEL
Chol/HDL Ratio: 2.7 ratio (ref 0.0–5.0)
Cholesterol, Total: 134 mg/dL (ref 100–199)
HDL: 49 mg/dL (ref >39)
LDL Chol Calc (NIH): 68 mg/dL (ref 0–99)
Triglycerides: 86 mg/dL (ref 0–149)
VLDL Cholesterol Cal: 17 mg/dL (ref 5–40)

## 2022-07-29 LAB — AST: AST: 17 IU/L (ref 0–40)

## 2022-08-01 ENCOUNTER — Telehealth: Payer: Self-pay

## 2022-08-01 NOTE — Telephone Encounter (Signed)
LM to return my call. 

## 2022-08-01 NOTE — Telephone Encounter (Signed)
-----   Message from Park Liter, MD sent at 08/01/2022 11:03 AM EDT ----- Cholesterol looks good, liver function test looks good, continue present management

## 2022-08-04 ENCOUNTER — Telehealth: Payer: Self-pay

## 2022-08-04 NOTE — Telephone Encounter (Signed)
Results reviewed with pt as per Dr. Krasowski's note.  Pt verbalized understanding and had no additional questions. Routed to PCP  

## 2022-08-27 DIAGNOSIS — E785 Hyperlipidemia, unspecified: Secondary | ICD-10-CM | POA: Diagnosis not present

## 2022-08-27 DIAGNOSIS — E1159 Type 2 diabetes mellitus with other circulatory complications: Secondary | ICD-10-CM | POA: Diagnosis not present

## 2022-09-01 DIAGNOSIS — Z6830 Body mass index (BMI) 30.0-30.9, adult: Secondary | ICD-10-CM | POA: Diagnosis not present

## 2022-09-01 DIAGNOSIS — E785 Hyperlipidemia, unspecified: Secondary | ICD-10-CM | POA: Diagnosis not present

## 2022-09-01 DIAGNOSIS — Z139 Encounter for screening, unspecified: Secondary | ICD-10-CM | POA: Diagnosis not present

## 2022-09-01 DIAGNOSIS — Z23 Encounter for immunization: Secondary | ICD-10-CM | POA: Diagnosis not present

## 2022-09-01 DIAGNOSIS — E1159 Type 2 diabetes mellitus with other circulatory complications: Secondary | ICD-10-CM | POA: Diagnosis not present

## 2022-09-01 DIAGNOSIS — E039 Hypothyroidism, unspecified: Secondary | ICD-10-CM | POA: Diagnosis not present

## 2022-09-01 DIAGNOSIS — Z1331 Encounter for screening for depression: Secondary | ICD-10-CM | POA: Diagnosis not present

## 2022-09-01 DIAGNOSIS — I10 Essential (primary) hypertension: Secondary | ICD-10-CM | POA: Diagnosis not present

## 2023-01-07 DIAGNOSIS — E785 Hyperlipidemia, unspecified: Secondary | ICD-10-CM | POA: Diagnosis not present

## 2023-01-07 DIAGNOSIS — E1159 Type 2 diabetes mellitus with other circulatory complications: Secondary | ICD-10-CM | POA: Diagnosis not present

## 2023-01-07 DIAGNOSIS — Z125 Encounter for screening for malignant neoplasm of prostate: Secondary | ICD-10-CM | POA: Diagnosis not present

## 2023-01-14 DIAGNOSIS — Z6831 Body mass index (BMI) 31.0-31.9, adult: Secondary | ICD-10-CM | POA: Diagnosis not present

## 2023-01-14 DIAGNOSIS — E1159 Type 2 diabetes mellitus with other circulatory complications: Secondary | ICD-10-CM | POA: Diagnosis not present

## 2023-01-14 DIAGNOSIS — I4891 Unspecified atrial fibrillation: Secondary | ICD-10-CM | POA: Diagnosis not present

## 2023-01-14 DIAGNOSIS — E785 Hyperlipidemia, unspecified: Secondary | ICD-10-CM | POA: Diagnosis not present

## 2023-01-14 DIAGNOSIS — E039 Hypothyroidism, unspecified: Secondary | ICD-10-CM | POA: Diagnosis not present

## 2023-02-24 DIAGNOSIS — E538 Deficiency of other specified B group vitamins: Secondary | ICD-10-CM | POA: Diagnosis not present

## 2023-04-23 ENCOUNTER — Ambulatory Visit: Payer: BC Managed Care – PPO | Attending: Cardiology | Admitting: Cardiology

## 2023-04-23 ENCOUNTER — Encounter: Payer: Self-pay | Admitting: Cardiology

## 2023-04-23 ENCOUNTER — Ambulatory Visit: Payer: BC Managed Care – PPO

## 2023-04-23 VITALS — BP 120/78 | HR 62 | Ht 69.0 in | Wt 207.2 lb

## 2023-04-23 DIAGNOSIS — I480 Paroxysmal atrial fibrillation: Secondary | ICD-10-CM | POA: Diagnosis not present

## 2023-04-23 DIAGNOSIS — I10 Essential (primary) hypertension: Secondary | ICD-10-CM | POA: Diagnosis not present

## 2023-04-23 DIAGNOSIS — E785 Hyperlipidemia, unspecified: Secondary | ICD-10-CM

## 2023-04-23 DIAGNOSIS — Z951 Presence of aortocoronary bypass graft: Secondary | ICD-10-CM

## 2023-04-23 NOTE — Patient Instructions (Addendum)
Medication Instructions:  Your physician recommends that you continue on your current medications as directed. Please refer to the Current Medication list given to you today.  *If you need a refill on your cardiac medications before your next appointment, please call your pharmacy*   Lab Work: None Ordered If you have labs (blood work) drawn today and your tests are completely normal, you will receive your results only by: MyChart Message (if you have MyChart) OR A paper copy in the mail If you have any lab test that is abnormal or we need to change your treatment, we will call you to review the results.   Testing/Procedures:  WHY IS MY DOCTOR PRESCRIBING ZIO? The Zio system is proven and trusted by physicians to detect and diagnose irregular heart rhythms -- and has been prescribed to hundreds of thousands of patients.  The FDA has cleared the Zio system to monitor for many different kinds of irregular heart rhythms. In a study, physicians were able to reach a diagnosis 90% of the time with the Zio system1.  You can wear the Zio monitor -- a small, discreet, comfortable patch -- during your normal day-to-day activity, including while you sleep, shower, and exercise, while it records every single heartbeat for analysis.  1Barrett, P., et al. Comparison of 24 Hour Holter Monitoring Versus 14 Day Novel Adhesive Patch Electrocardiographic Monitoring. American Journal of Medicine, 2014.  ZIO VS. HOLTER MONITORING The Zio monitor can be comfortably worn for up to 14 days. Holter monitors can be worn for 24 to 48 hours, limiting the time to record any irregular heart rhythms you may have. Zio is able to capture data for the 51% of patients who have their first symptom-triggered arrhythmia after 48 hours.1  LIVE WITHOUT RESTRICTIONS The Zio ambulatory cardiac monitor is a small, unobtrusive, and water-resistant patch--you might even forget you're wearing it. The Zio monitor records and stores  every beat of your heart, whether you're sleeping, working out, or showering.     Follow-Up: At [facility], you and your health needs are our priority.  As part of our continuing mission to provide you with exceptional heart care, we have created designated Provider Care Teams.  These Care Teams include your primary Cardiologist (physician) and Advanced Practice Providers (APPs -  Physician Assistants and Nurse Practitioners) who all work together to provide you with the care you need, when you need it.  We recommend signing up for the patient portal called "MyChart".  Sign up information is provided on this After Visit Summary.  MyChart is used to connect with patients for Virtual Visits (Telemedicine).  Patients are able to view lab/test results, encounter notes, upcoming appointments, etc.  Non-urgent messages can be sent to your provider as well.   To learn more about what you can do with MyChart, go to [email or web address]://[email or web address].com.    Your next appointment:   6 month(s)  The format for your next appointment:   In Person  Provider:   Robert Krasowski, MD    Other Instructions NA  

## 2023-04-23 NOTE — Addendum Note (Signed)
Addended by: Baldo Ash D on: 04/23/2023 11:33 AM   Modules accepted: Orders

## 2023-04-23 NOTE — Progress Notes (Signed)
Cardiology Office Note:    Date:  04/23/2023   ID:  Nicholas Chambers, DOB 11/16/1960, MRN [id number]  PCP:  Charlott Rakes, MD  Cardiologist:  Gypsy Balsam, MD    Referring MD: Noni Saupe, MD   Chief Complaint  Patient presents with   Follow-up    History of Present Illness:    Nicholas Chambers is a 62 y.o. male  Her past medical history significant for coronary artery disease, in 2019 he had coronary artery bypass graft additional problem include essential hypertension, dyslipidemia as well as paroxysmal atrial fibrillation in the summer of last year he end up in the hospital because of episode of atrial fibrillation, converted spontaneously anticoagulation has been initiated and he is being anticoagulated since that time. He comes today to months for follow-up we will doing very well.  Denies have any chest pain tightness squeezing pressure burning chest.  But he described to have some palpitations on and off lasting sometimes even all day.  Again he is anticoagulated he does have Cardizem to take on as-needed basis which she does and that usually slow down his heart rate to comfortable level.  He have no lipid mutation in terms of exercises and ability to do things.  Past Medical History:  Diagnosis Date   Atrial fibrillation (HCC) 05/21/2018   Postoperative following CABG   CAD (coronary artery disease) 06/03/2018   CHEST PAIN-UNSPECIFIED 03/01/2009   Qualifier: Diagnosis of  By: Bascom Levels, RMA, Sherri     Diabetes mellitus (HCC)    Essential hypertension 03/01/2009   Qualifier: Diagnosis of  By: Bascom Levels, RMA, Sherri     GERD 03/01/2009   Qualifier: Diagnosis of  By: Bascom Levels, RMA, Sherri     HYPERLIPIDEMIA-MIXED 03/01/2009   Qualifier: Diagnosis of  By: Bascom Levels, RMA, Sherri     Hypertension    Hyperthyroidism    HYPOTHYROIDISM 03/01/2009   Qualifier: Diagnosis of  By: Bascom Levels, RMA, Sherri     NSTEMI (non-ST elevated myocardial infarction) (HCC) 05/17/2018   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   Tingling in extremities 06/09/2014    Past Surgical History:  Procedure Laterality Date   CORONARY ARTERY BYPASS GRAFT N/A 05/19/2018   Procedure: CORONARY ARTERY BYPASS GRAFTING (CABG) x 4 WITH ENDOSCOPIC HARVESTING OF RIGHT SAPHENOUS VEIN;  Surgeon: Alleen Borne, MD;  Location: [facility] OR;  Service: Open Heart Surgery;  Laterality: N/A;   LEFT HEART CATH AND CORONARY ANGIOGRAPHY N/A 05/18/2018   Procedure: LEFT HEART CATH AND CORONARY ANGIOGRAPHY;  Surgeon: Lyn Records, MD;  Location: [facility] INVASIVE [facility];  Service: Cardiovascular;  Laterality: N/A;   SHOULDER SURGERY     one on R, two on L   TEE WITHOUT CARDIOVERSION N/A 05/19/2018   Procedure: TRANSESOPHAGEAL ECHOCARDIOGRAM (TEE);  Surgeon: Alleen Borne, MD;  Location: [facility] OR;  Service: Open Heart Surgery;  Laterality: N/A;   UMBILICAL HERNIA REPAIR      Current Medications: Current Meds  Medication Sig   ELIQUIS 5 MG TABS tablet Take 5 mg by mouth 2 (two) times daily.   empagliflozin (JARDIANCE) 25 MG TABS tablet Take 25 mg by mouth daily.   esomeprazole (NEXIUM) 40 MG capsule Take 40 mg by mouth daily before breakfast.   ezetimibe (ZETIA) 10 MG tablet Take 1 tablet (10 mg total) by mouth daily.   gabapentin (NEURONTIN) 300 MG capsule Take 300 mg by mouth 3 (three) times daily.   levothyroxine (SYNTHROID) 100 MCG tablet Take 100 mcg by mouth  daily before breakfast.   metoprolol succinate (TOPROL-XL) 25 MG 24 hr tablet Take 12.5 mg by mouth 2 (two) times daily.   olmesartan (BENICAR) 20 MG tablet Take 20 mg by mouth daily.   OZEMPIC, 1 MG/DOSE, 4 MG/3ML SOPN Inject 1 mg into the skin once a week.   REPATHA SURECLICK 140 MG/ML SOAJ Inject 1 mL into the skin every 14 (fourteen) days.   sildenafil (VIAGRA) 50 MG tablet Take 50 mg by mouth daily as needed for erectile dysfunction.   [DISCONTINUED] aspirin EC 81 MG tablet Take 81 mg by mouth daily. Swallow whole.   [DISCONTINUED] Cyanocobalamin 1000 MCG/ML KIT Inject 1 mL  as directed every 30 (thirty) days.   [DISCONTINUED] diltiazem (CARDIZEM) 30 MG tablet Take 1 tablet every 6 hours as needed for tachycardia (Patient taking differently: Take 30 mg by mouth See admin instructions. Take 1 tablet every 6 hours as needed for tachycardia)     Allergies:   Crestor [rosuvastatin calcium], Lipitor [atorvastatin calcium], Metformin and related, Niacin and related, and Viibryd [vilazodone hcl]   Social History   Socioeconomic History   Marital status: Married    Spouse name: Not on file   Number of children: Not on file   Years of education: Not on file   Highest education level: Not on file  Occupational History   Not on file  Tobacco Use   Smoking status: Never   Smokeless tobacco: Never  Vaping Use   Vaping Use: Never used  Substance and Sexual Activity   Alcohol use: Never   Drug use: Never   Sexual activity: Not Currently  Other Topics Concern   Not on file  Social History Narrative   Not on file   Social Determinants of Health   Financial Resource Strain: Not on file  Food Insecurity: Not on file  Transportation Needs: Not on file  Physical Activity: Not on file  Stress: Not on file  Social Connections: Not on file     Family History: The patient's family history includes Diabetes in his brother. ROS:   Please see the history of present illness.    All 14 point review of systems negative except as described per history of present illness  EKGs/Labs/Other Studies Reviewed:      Recent Labs: 07/28/2022: ALT 22  Recent Lipid Panel    Component Value Date/Time   CHOL 134 07/28/2022 0841   TRIG 86 07/28/2022 0841   HDL 49 07/28/2022 0841   CHOLHDL 2.7 07/28/2022 0841   CHOLHDL 6.0 05/18/2018 0028   VLDL 59 (H) 05/18/2018 0028   LDLCALC 68 07/28/2022 0841    Physical Exam:    VS:  BP 120/78 (BP Location: Left Arm, Patient Position: Sitting)   Pulse 62   Ht 5\' 9"  (1.753 m)   Wt 207 lb 3.2 oz (94 kg)   SpO2 95%   BMI 30.60  kg/m     Wt Readings from Last 3 Encounters:  04/23/23 207 lb 3.2 oz (94 kg)  06/09/22 213 lb (96.6 kg)  10/26/19 269 lb 9.6 oz (122.3 kg)     GEN:  Well nourished, well developed in no acute distress HEENT: Normal NECK: No JVD; No carotid bruits LYMPHATICS: No lymphadenopathy CARDIAC: RRR, no murmurs, no rubs, no gallops RESPIRATORY:  Clear to auscultation without rales, wheezing or rhonchi  ABDOMEN: Soft, non-tender, non-distended MUSCULOSKELETAL:  No edema; No deformity  SKIN: Warm and dry LOWER EXTREMITIES: no swelling NEUROLOGIC:  Alert and oriented x 3  PSYCHIATRIC:  Normal affect   ASSESSMENT:    1. Paroxysmal atrial fibrillation (HCC)   2. S/P CABG x 4   3. Essential hypertension   4. Dyslipidemia    PLAN:    In order of problems listed above:  Paroxysmal atrial fibrillation will ask him to wear Zio patch to see how much of atrial fibrillation to truly have I think we are reaching the point that antiarrhythmic may be considered.  Continue anticoagulation. Coronary artery disease status post coronary bypass grafting well from that point.  Continue present management. Essential hypertension well-controlled. Dyslipidemia I did review K PN which show me LDL 71 HDL 54 he is on Repatha which I will continue   Medication Adjustments/Labs and Tests Ordered: Current medicines are reviewed at length with the patient today.  Concerns regarding medicines are outlined above.  No orders of the defined types were placed in this encounter.  Medication changes: No orders of the defined types were placed in this encounter.   Signed, Georgeanna Lea, MD, [facility] 04/23/2023 11:28 AM    [facility]

## 2023-05-11 DIAGNOSIS — E039 Hypothyroidism, unspecified: Secondary | ICD-10-CM | POA: Diagnosis not present

## 2023-05-11 DIAGNOSIS — E785 Hyperlipidemia, unspecified: Secondary | ICD-10-CM | POA: Diagnosis not present

## 2023-05-11 DIAGNOSIS — E1159 Type 2 diabetes mellitus with other circulatory complications: Secondary | ICD-10-CM | POA: Diagnosis not present

## 2023-05-22 DIAGNOSIS — E039 Hypothyroidism, unspecified: Secondary | ICD-10-CM | POA: Diagnosis not present

## 2023-05-22 DIAGNOSIS — I4891 Unspecified atrial fibrillation: Secondary | ICD-10-CM | POA: Diagnosis not present

## 2023-05-22 DIAGNOSIS — I2510 Atherosclerotic heart disease of native coronary artery without angina pectoris: Secondary | ICD-10-CM | POA: Diagnosis not present

## 2023-05-22 DIAGNOSIS — E1159 Type 2 diabetes mellitus with other circulatory complications: Secondary | ICD-10-CM | POA: Diagnosis not present

## 2023-05-22 DIAGNOSIS — Z6829 Body mass index (BMI) 29.0-29.9, adult: Secondary | ICD-10-CM | POA: Diagnosis not present

## 2023-05-25 ENCOUNTER — Telehealth: Payer: Self-pay | Admitting: *Deleted

## 2023-05-25 NOTE — Telephone Encounter (Signed)
Spoke with pt about monitor. He stated his first one fell off after one day so they sent out another one which came to his house while he was out of town so he didn't get to put it on until a few days later. He just mailed this 2nd one back last Wed or Thurs so results should be coming in this week.

## 2023-07-15 DIAGNOSIS — E039 Hypothyroidism, unspecified: Secondary | ICD-10-CM | POA: Diagnosis not present

## 2023-07-20 ENCOUNTER — Other Ambulatory Visit: Payer: Self-pay | Admitting: Cardiology

## 2023-09-18 DIAGNOSIS — E1159 Type 2 diabetes mellitus with other circulatory complications: Secondary | ICD-10-CM | POA: Diagnosis not present

## 2023-09-18 DIAGNOSIS — E785 Hyperlipidemia, unspecified: Secondary | ICD-10-CM | POA: Diagnosis not present

## 2023-09-25 DIAGNOSIS — E1159 Type 2 diabetes mellitus with other circulatory complications: Secondary | ICD-10-CM | POA: Diagnosis not present

## 2023-09-25 DIAGNOSIS — E039 Hypothyroidism, unspecified: Secondary | ICD-10-CM | POA: Diagnosis not present

## 2023-09-25 DIAGNOSIS — Z1331 Encounter for screening for depression: Secondary | ICD-10-CM | POA: Diagnosis not present

## 2023-09-25 DIAGNOSIS — Z6830 Body mass index (BMI) 30.0-30.9, adult: Secondary | ICD-10-CM | POA: Diagnosis not present

## 2023-09-25 DIAGNOSIS — I4891 Unspecified atrial fibrillation: Secondary | ICD-10-CM | POA: Diagnosis not present

## 2023-09-25 DIAGNOSIS — E785 Hyperlipidemia, unspecified: Secondary | ICD-10-CM | POA: Diagnosis not present

## 2023-09-25 DIAGNOSIS — Z23 Encounter for immunization: Secondary | ICD-10-CM | POA: Diagnosis not present

## 2023-09-30 ENCOUNTER — Other Ambulatory Visit: Payer: Self-pay

## 2023-09-30 ENCOUNTER — Emergency Department (HOSPITAL_COMMUNITY)
Admission: EM | Admit: 2023-09-30 | Discharge: 2023-09-30 | Disposition: A | Discharge status: Home / Self Care | Payer: BC Managed Care – PPO | Attending: Emergency Medicine | Admitting: Emergency Medicine

## 2023-09-30 ENCOUNTER — Emergency Department (HOSPITAL_COMMUNITY): Payer: BC Managed Care – PPO

## 2023-09-30 DIAGNOSIS — I2510 Atherosclerotic heart disease of native coronary artery without angina pectoris: Secondary | ICD-10-CM | POA: Diagnosis not present

## 2023-09-30 DIAGNOSIS — R002 Palpitations: Secondary | ICD-10-CM

## 2023-09-30 DIAGNOSIS — I10 Essential (primary) hypertension: Secondary | ICD-10-CM | POA: Insufficient documentation

## 2023-09-30 DIAGNOSIS — M25519 Pain in unspecified shoulder: Secondary | ICD-10-CM | POA: Diagnosis not present

## 2023-09-30 DIAGNOSIS — Z7984 Long term (current) use of oral hypoglycemic drugs: Secondary | ICD-10-CM | POA: Diagnosis not present

## 2023-09-30 DIAGNOSIS — Z7901 Long term (current) use of anticoagulants: Secondary | ICD-10-CM | POA: Insufficient documentation

## 2023-09-30 DIAGNOSIS — R072 Precordial pain: Secondary | ICD-10-CM

## 2023-09-30 DIAGNOSIS — K838 Other specified diseases of biliary tract: Secondary | ICD-10-CM | POA: Diagnosis not present

## 2023-09-30 DIAGNOSIS — E039 Hypothyroidism, unspecified: Secondary | ICD-10-CM | POA: Diagnosis not present

## 2023-09-30 DIAGNOSIS — Z951 Presence of aortocoronary bypass graft: Secondary | ICD-10-CM | POA: Diagnosis not present

## 2023-09-30 DIAGNOSIS — R079 Chest pain, unspecified: Secondary | ICD-10-CM | POA: Diagnosis not present

## 2023-09-30 DIAGNOSIS — R0789 Other chest pain: Secondary | ICD-10-CM | POA: Diagnosis not present

## 2023-09-30 DIAGNOSIS — I4891 Unspecified atrial fibrillation: Secondary | ICD-10-CM | POA: Insufficient documentation

## 2023-09-30 DIAGNOSIS — R1011 Right upper quadrant pain: Secondary | ICD-10-CM | POA: Diagnosis not present

## 2023-09-30 DIAGNOSIS — E119 Type 2 diabetes mellitus without complications: Secondary | ICD-10-CM | POA: Insufficient documentation

## 2023-09-30 HISTORY — DX: Palpitations: R00.2

## 2023-09-30 LAB — CBC
HCT: 44.7 % (ref 39.0–52.0)
Hemoglobin: 14.9 g/dL (ref 13.0–17.0)
MCH: 29.7 pg (ref 26.0–34.0)
MCHC: 33.3 g/dL (ref 30.0–36.0)
MCV: 89.2 fL (ref 80.0–100.0)
Platelets: 168 10*3/uL (ref 150–400)
RBC: 5.01 MIL/uL (ref 4.22–5.81)
RDW: 12.7 % (ref 11.5–15.5)
WBC: 5.1 10*3/uL (ref 4.0–10.5)
nRBC: 0 % (ref 0.0–0.2)

## 2023-09-30 LAB — COMPREHENSIVE METABOLIC PANEL
ALT: 30 U/L (ref 0–44)
AST: 22 U/L (ref 15–41)
Albumin: 4.1 g/dL (ref 3.5–5.0)
Alkaline Phosphatase: 41 U/L (ref 38–126)
Anion gap: 8 (ref 5–15)
BUN: 18 mg/dL (ref 8–23)
CO2: 25 mmol/L (ref 22–32)
Calcium: 9 mg/dL (ref 8.9–10.3)
Chloride: 107 mmol/L (ref 98–111)
Creatinine, Ser: 1.11 mg/dL (ref 0.61–1.24)
GFR, Estimated: >60 mL/min (ref >60)
Glucose, Bld: 107 mg/dL — ABNORMAL HIGH (ref 70–99)
Potassium: 4 mmol/L (ref 3.5–5.1)
Sodium: 140 mmol/L (ref 135–145)
Total Bilirubin: 0.6 mg/dL (ref <1.2)
Total Protein: 6.6 g/dL (ref 6.5–8.1)

## 2023-09-30 LAB — TROPONIN I (HIGH SENSITIVITY)
Troponin I (High Sensitivity): 3 ng/L (ref <18)
Troponin I (High Sensitivity): 3 ng/L (ref <18)

## 2023-09-30 MED ORDER — METOPROLOL SUCCINATE ER 50 MG PO TB24: 50.0000 mg | ORAL_TABLET | Freq: Every day | ORAL | 1 refills | Status: AC

## 2023-09-30 NOTE — Discharge Instructions (Addendum)
You can take 50 mg of your metoprolol every day.  You will be contacted for a follow-up appointment with our cardiology group.  Return the emergency department for any new or worsening chest pain, or difficulty with your breathing.

## 2023-09-30 NOTE — ED Provider Notes (Signed)
[place] EMERGENCY DEPARTMENT AT [facility] Provider Note  CSN: [id number] Arrival date & time: 09/30/23 9562  Chief Complaint(s) Chest Pain and Shoulder Pain  HPI Nicholas Chambers is a 62 y.o. male here today for chest pain.  Patient has past medical history significant for CABG in 2019.  Patient says that he has been having intermittent periods of chest pain.  He has paroxysmal A-fib, and thought that he may have been experiencing that earlier in the week.  He took some of his metoprolol, no longer feels as though he is in A-fib.  He does not have any shortness of breath.  He does feel some indigestion.   Past Medical History Past Medical History:  Diagnosis Date   Atrial fibrillation (HCC) 05/21/2018   Postoperative following CABG   CAD (coronary artery disease) 06/03/2018   CHEST PAIN-UNSPECIFIED 03/01/2009   Qualifier: Diagnosis of  By: Bascom Levels, RMA, Sherri     Diabetes mellitus (HCC)    Essential hypertension 03/01/2009   Qualifier: Diagnosis of  By: Bascom Levels, RMA, Sherri     GERD 03/01/2009   Qualifier: Diagnosis of  By: Bascom Levels, RMA, Sherri     HYPERLIPIDEMIA-MIXED 03/01/2009   Qualifier: Diagnosis of  By: Bascom Levels, RMA, Sherri     Hypertension    Hyperthyroidism    HYPOTHYROIDISM 03/01/2009   Qualifier: Diagnosis of  By: Bascom Levels, RMA, Sherri     NSTEMI (non-ST elevated myocardial infarction) (HCC) 05/17/2018   S/P CABG x 4 05/19/2018   Tingling in extremities 06/09/2014   Patient Active Problem List   Diagnosis Date Noted   CAD (coronary artery disease) 06/03/2018   Atrial fibrillation (HCC) 05/21/2018   S/P CABG x 4 05/19/2018   NSTEMI (non-ST elevated myocardial infarction) (HCC) 05/17/2018   Tingling in extremities 06/09/2014   Hypothyroidism 03/01/2009   Dyslipidemia 03/01/2009   Essential hypertension 03/01/2009   GERD 03/01/2009   CHEST PAIN-UNSPECIFIED 03/01/2009   Home Medication(s) Prior to Admission medications   Medication Sig Start Date End  Date Taking? Authorizing Provider  diltiazem (CARDIZEM) 30 MG tablet Take 30 mg by mouth daily as needed (a.fib).   Yes [provider]  ELIQUIS 5 MG TABS tablet Take 5 mg by mouth 2 (two) times daily. 04/29/22  Yes [provider]  empagliflozin (JARDIANCE) 25 MG TABS tablet Take 25 mg by mouth daily.   Yes [provider]  esomeprazole (NEXIUM) 40 MG capsule Take 40 mg by mouth daily before breakfast.   Yes [provider]  ezetimibe (ZETIA) 10 MG tablet Take 1 tablet (10 mg total) by mouth daily. Patient taking differently: Take 10 mg by mouth at bedtime. 07/21/23  Yes Georgeanna Lea, MD  gabapentin (NEURONTIN) 300 MG capsule Take 300 mg by mouth 2 (two) times daily. 05/19/22  Yes [provider]  levothyroxine (SYNTHROID) 112 MCG tablet Take 112 mcg by mouth daily before breakfast.   Yes [provider]  olmesartan (BENICAR) 20 MG tablet Take 20 mg by mouth daily. 06/02/22  Yes [provider]  OZEMPIC, 1 MG/DOSE, 4 MG/3ML SOPN Inject 1 mg into the skin once a week. 06/04/22  Yes [provider]  REPATHA SURECLICK 140 MG/ML SOAJ Inject 1 mL into the skin every 14 (fourteen) days. 05/20/22  Yes [provider]  sildenafil (VIAGRA) 50 MG tablet Take 50 mg by mouth daily as needed for erectile dysfunction.   Yes [provider]  metoprolol succinate (TOPROL-XL) 50 MG 24 hr tablet Take 1  tablet (50 mg total) by mouth daily. 09/30/23   Arty Baumgartner, NP                                                                                                                                    Past Surgical History Past Surgical History:  Procedure Laterality Date   CORONARY ARTERY BYPASS GRAFT N/A 05/19/2018   Procedure: CORONARY ARTERY BYPASS GRAFTING (CABG) x 4 WITH ENDOSCOPIC HARVESTING OF RIGHT SAPHENOUS VEIN;  Surgeon: Alleen Borne, MD;  Location: [facility] OR;  Service: Open Heart Surgery;  Laterality: N/A;   LEFT  HEART CATH AND CORONARY ANGIOGRAPHY N/A 05/18/2018   Procedure: LEFT HEART CATH AND CORONARY ANGIOGRAPHY;  Surgeon: Lyn Records, MD;  Location: [facility] INV[facility];  Service: Cardiovascular;  Laterality: N/A;   SHOULDER SURGERY     one on R, two on L   TEE WITHOUT CARDIOVERSION N/A 05/19/2018   Procedure: TRANSESOPHAGEAL ECHOCARDIOGRAM (TEE);  Surgeon: Alleen Borne, MD;  Location: [facility];  Service: Open Heart Surgery;  Laterality: N/A;   UMBILICAL HERNIA REPAIR     Family History Family History  Problem Relation Age of Onset   Diabetes Brother     Social History Social History   Tobacco Use   Smoking status: Never   Smokeless tobacco: Never  Vaping Use   Vaping status: Never Used  Substance Use Topics   Alcohol use: Never   Drug use: Never   Allergies Crestor [rosuvastatin calcium], Lipitor [atorvastatin calcium], Metformin and related, Niacin and related, and Viibryd [vilazodone hcl]  Review of Systems Review of Systems  Physical Exam Vital Signs  I have reviewed the triage vital signs BP 105/71   Pulse (!) 57   Temp 98 F (36.7 C) (Oral)   Resp 13   Ht 5\' 9"  (1.753 m)   Wt 90.7 kg   SpO2 100%   BMI 29.53 kg/m   Physical Exam Vitals reviewed.  Cardiovascular:     Rate and Rhythm: Normal rate and regular rhythm.     Heart sounds: Normal heart sounds.  Abdominal:     Palpations: Abdomen is soft.     Tenderness: There is no abdominal tenderness.  Musculoskeletal:     Cervical back: Normal range of motion.  Neurological:     Mental Status: He is alert.     ED Results and Treatments Labs (all labs ordered are listed, but only abnormal results are displayed) Labs Reviewed  COMPREHENSIVE METABOLIC PANEL - Abnormal; Notable for the following components:      Result Value   Glucose, Bld 107 (*)    All other components within normal limits  CBC  TROPONIN I (HIGH SENSITIVITY)  TROPONIN I (HIGH SENSITIVITY)  Radiology US Abdomen Limited RUQ (LIVER/GB)  Result Date: 09/30/2023 CLINICAL DATA:  Right upper quadrant abdominal pain EXAM: ULTRASOUND ABDOMEN LIMITED RIGHT UPPER QUADRANT COMPARISON:  None Available. FINDINGS: Gallbladder: No gallstones or wall thickening visualized. No sonographic Murphy sign noted by sonographer. Common bile duct: Diameter: 5 mm Mild intrahepatic bile duct dilation. Liver: No focal lesion identified. Within normal limits in parenchymal echogenicity. Portal vein is patent on color Doppler imaging with normal direction of blood flow towards the liver. Other: None. IMPRESSION: 1. Mild intrahepatic bile duct dilation. Common bile duct measures 5 mm, within normal limits. Recommend correlation with liver function tests. 2. No cholelithiasis or sonographic evidence of acute cholecystitis. Electronically Signed   By: Agustin Cree M.D.   On: 09/30/2023 10:11   DG Chest Port 1 View  Result Date: 09/30/2023 CLINICAL DATA:  Chest and shoulder pain EXAM: PORTABLE CHEST 1 VIEW COMPARISON:  Chest radiograph dated 03/22/2022 FINDINGS: Normal lung volumes. No focal consolidations. No pleural effusion or pneumothorax. The heart size and mediastinal contours are within normal limits. Median sternotomy wires are nondisplaced. IMPRESSION: No active disease. Electronically Signed   By: Agustin Cree M.D.   On: 09/30/2023 10:08    Pertinent labs & imaging results that were available during my care of the patient were reviewed by me and considered in my medical decision making (see MDM for details).  Medications Ordered in ED Medications - No data to display                                                                                                                                   Procedures Procedures  (including critical care time)  Medical Decision Making / ED Course   This patient presents to the ED for concern of  chest pain, this involves an extensive number of treatment options, and is a complaint that carries with it a high risk of complications and morbidity.  The differential diagnosis includes ACS, atrial fibrillation, PVCs, cholelithiasis.  MDM: With the patient history of CABG, he has a high risk chest pain.  On the monitor, did notice patient having intermittent PVCs which could be contributing to some of the symptoms he has been feeling.  Patient will require at least a delta troponin.  Chest x-ray ordered.  Patient's daughter, who is at bedside, mentioned that she was concerned about the patient's gallbladder.  Patient without any right upper quadrant tenderness.  Reassessment 2:30 PM-patient's troponin negative.  Right upper quadrant ultrasound negative.  With the patient's nature of his chest pain, and his cardiac history, I had cardiology come and evaluate the patient.  They agreed with close outpatient follow-up for having him increase his metoprolol.  Patient is agreeable with this plan.  Will discharge patient at this time.   Additional history obtained: -Additional history obtained from daughter at bedside -External records from outside source obtained and reviewed including: Chart review including previous notes, labs,  imaging, consultation notes   Lab Tests: -I ordered, reviewed, and interpreted labs.   The pertinent results include:   Labs Reviewed  COMPREHENSIVE METABOLIC PANEL - Abnormal; Notable for the following components:      Result Value   Glucose, Bld 107 (*)    All other components within normal limits  CBC  TROPONIN I (HIGH SENSITIVITY)  TROPONIN I (HIGH SENSITIVITY)      EKG my independent review the patient's EKG shows no ST segment depressions or elevations, no T wave versions, no evidence of acute ischemia.  EKG Interpretation Date/Time:  Wednesday September 30 2023 08:52:05 EST Ventricular Rate:  68 PR Interval:  138 QRS Duration:  97 QT  Interval:  383 QTC Calculation: 408 R Axis:   38  Text Interpretation: Sinus rhythm Multiform ventricular premature complexes Probable anteroseptal infarct, old Confirmed by Anders Simmonds [phone]) on 09/30/2023 11:50:07 AM         Imaging Studies ordered: I ordered imaging studies including chest x-ray, right upper quadrant ultrasound I independently visualized and interpreted imaging. I agree with the radiologist interpretation   Medicines ordered and prescription drug management: Meds ordered this encounter  Medications   metoprolol succinate (TOPROL-XL) 50 MG 24 hr tablet    Sig: Take 1 tablet (50 mg total) by mouth daily.    Dispense:  90 tablet    Refill:  1    -I have reviewed the patients home medicines and have made adjustments as needed   Consultations Obtained: I requested consultation with the cardiology team,  and discussed lab and imaging findings as well as pertinent plan - they recommend: Discharge with follow-up   Cardiac Monitoring: The patient was maintained on a cardiac monitor.  I personally viewed and interpreted the cardiac monitored which showed an underlying rhythm of: Normal sinus rhythm  Social Determinants of Health:  Factors impacting patients care include:    Reevaluation: After the interventions noted above, I reevaluated the patient and found that they have :improved  Co morbidities that complicate the patient evaluation  Past Medical History:  Diagnosis Date   Atrial fibrillation (HCC) 05/21/2018   Postoperative following CABG   CAD (coronary artery disease) 06/03/2018   CHEST PAIN-UNSPECIFIED 03/01/2009   Qualifier: Diagnosis of  By: Bascom Levels, RMA, Sherri     Diabetes mellitus (HCC)    Essential hypertension 03/01/2009   Qualifier: Diagnosis of  By: Bascom Levels, RMA, Sherri     GERD 03/01/2009   Qualifier: Diagnosis of  By: Bascom Levels, RMA, Sherri     HYPERLIPIDEMIA-MIXED 03/01/2009   Qualifier: Diagnosis of  By: Bascom Levels, RMA, Sherri      Hypertension    Hyperthyroidism    HYPOTHYROIDISM 03/01/2009   Qualifier: Diagnosis of  By: Bascom Levels, RMA, Sherri     NSTEMI (non-ST elevated myocardial infarction) (HCC) 05/17/2018   S/P CABG x 4 05/19/2018   Tingling in extremities 06/09/2014      Dispostion: I considered admission for this patient, but with consultation with cardiology, believe he is appropriate for outpatient follow-up.     Final Clinical Impression(s) / ED Diagnoses Final diagnoses:  Chest pain, unspecified type     @PCDICTATION @    Anders Simmonds T, DO 09/30/23 1434

## 2023-09-30 NOTE — ED Triage Notes (Signed)
Pt. Stated, I started having some chest pain with shoulder pain

## 2023-09-30 NOTE — Consult Note (Addendum)
Cardiology Consultation   Patient ID: Nicholas Chambers MRN: [id number]; DOB: [birth year]  Admit date: 09/30/2023 Date of Consult: 09/30/2023  PCP:  Charlott Rakes, MD   [facility] Providers Cardiologist:  Gypsy Balsam, MD     Patient Profile:   Nicholas Chambers is a 62 y.o. male with a hx of CAD s/p 4v CABG '19 (LIMA to LAD, SVG to OM, SVG to PDA, SVG to PL), HTN, HLD, hypothyroidism, DM, GERD, paroxysmal atrial fibrillation who is being seen 09/30/2023 for the evaluation of chest pain at the request of Dr. Andria Meuse.  History of Present Illness:   Nicholas Chambers is a 62 yo male with PMH noted above. He has been followed by Dr. Bing Matter as an outpatient.  He underwent four-vessel CABG in 2019.  Also has a history of paroxysmal atrial fibrillation initially diagnosed in 2023 at which time he was hospitalized and converted spontaneously.  Was placed on anticoagulation Eliquis 5 mg twice daily.   He was last seen in the office 04/2023 where he denied any chest pain or burning did describe some periodic palpitations.  Was recommended that he wear a Zio patch, which showed 5 episodes of SVT.  Presented to the ED on 11/20 with palpitations and chest pain.  Reports he has been doing well since having had his bypass surgery.  He remains very active and walks twice a day while he is at work.  He has been in his usual state of health up until today while he was walking this morning whenever he developed palpitations and centralized chest tightness and shortness of breath.  Ultimately ended up taking 2 of his as needed diltiazem 30 mg tablets and things settle down.  Presented to the ED for further evaluation.  Labs in the ED showed sodium 140, potassium 4, creatinine 1.1, high-sensitivity troponin negative x 2, WBC 5.1, hemoglobin 14.9.  EKG shows sinus rhythm, PVCs, 68 bpm.  Chest x-ray negative.  Past Medical History:  Diagnosis Date   Atrial fibrillation (HCC) 05/21/2018    Postoperative following CABG   CAD (coronary artery disease) 06/03/2018   CHEST PAIN-UNSPECIFIED 03/01/2009   Qualifier: Diagnosis of  By: Bascom Levels, RMA, Sherri     Diabetes mellitus (HCC)    Essential hypertension 03/01/2009   Qualifier: Diagnosis of  By: Bascom Levels, RMA, Sherri     GERD 03/01/2009   Qualifier: Diagnosis of  By: Bascom Levels, RMA, Sherri     HYPERLIPIDEMIA-MIXED 03/01/2009   Qualifier: Diagnosis of  By: Bascom Levels, RMA, Sherri     Hypertension    Hyperthyroidism    HYPOTHYROIDISM 03/01/2009   Qualifier: Diagnosis of  By: Bascom Levels, RMA, Sherri     NSTEMI (non-ST elevated myocardial infarction) (HCC) 05/17/2018   S/P CABG x 4 05/19/2018   Tingling in extremities 06/09/2014    Past Surgical History:  Procedure Laterality Date   CORONARY ARTERY BYPASS GRAFT N/A 05/19/2018   Procedure: CORONARY ARTERY BYPASS GRAFTING (CABG) x 4 WITH ENDOSCOPIC HARVESTING OF RIGHT SAPHENOUS VEIN;  Surgeon: Alleen Borne, MD;  Location: [facility] OR;  Service: Open Heart Surgery;  Laterality: N/A;   LEFT HEART CATH AND CORONARY ANGIOGRAPHY N/A 05/18/2018   Procedure: LEFT HEART CATH AND CORONARY ANGIOGRAPHY;  Surgeon: Lyn Records, MD;  Location: [facility] INVASIVE CV LAB;  Service: Cardiovascular;  Laterality: N/A;   SHOULDER SURGERY     one on R, two on L   TEE WITHOUT CARDIOVERSION N/A 05/19/2018   Procedure: TRANSESOPHAGEAL ECHOCARDIOGRAM (TEE);  Surgeon: Laneta Simmers,  Payton Doughty, MD;  Location: [facility] OR;  Service: Open Heart Surgery;  Laterality: N/A;   UMBILICAL HERNIA REPAIR       Inpatient Medications: Scheduled Meds:  Continuous Infusions:  PRN Meds:   Allergies:    Allergies  Allergen Reactions   Crestor [Rosuvastatin Calcium] Other (See Comments)    Malaise     Lipitor [Atorvastatin Calcium] Other (See Comments)    malaise   Metformin And Related Other (See Comments)    GI Intolerance    Niacin And Related Other (See Comments)    Fatigue and chest pain   Viibryd [Vilazodone Hcl] Other (See Comments)     Hypomania     Social History:   Social History   Socioeconomic History   Marital status: Married    Spouse name: Not on file   Number of children: Not on file   Years of education: Not on file   Highest education level: Not on file  Occupational History   Not on file  Tobacco Use   Smoking status: Never   Smokeless tobacco: Never  Vaping Use   Vaping status: Never Used  Substance and Sexual Activity   Alcohol use: Never   Drug use: Never   Sexual activity: Not Currently  Other Topics Concern   Not on file  Social History Narrative   Not on file   Social Determinants of Health   Financial Resource Strain: Not on file  Food Insecurity: Not on file  Transportation Needs: Not on file  Physical Activity: Not on file  Stress: Not on file  Social Connections: Not on file  Intimate Partner Violence: Not At Risk (05/18/2018)   Humiliation, Afraid, Rape, and Kick questionnaire    Fear of Current or Ex-Partner: No    Emotionally Abused: No    Physically Abused: No    Sexually Abused: No    Family History:   Family History  Problem Relation Age of Onset   Diabetes Brother      ROS:  Please see the history of present illness.   All other ROS reviewed and negative.     Physical Exam/Data:   Vitals:   09/30/23 1200 09/30/23 1245 09/30/23 1400 09/30/23 1420  BP: 105/67 135/78 105/71   Pulse: (!) 58 64 (!) 57   Resp: 17 16 13    Temp:    98 F (36.7 C)  TempSrc:    Oral  SpO2: 98% 100% 100%   Weight:      Height:       No intake or output data in the 24 hours ending 09/30/23 1429    09/30/2023    8:53 AM 04/23/2023   11:15 AM 06/09/2022    9:11 AM  Last 3 Weights  Weight (lbs) 200 lb 207 lb 3.2 oz 213 lb  Weight (kg) 90.719 kg 93.985 kg 96.616 kg     Body mass index is 29.53 kg/m.  General:  Well nourished, well developed, in no acute distress HEENT: normal Neck: no JVD Vascular: No carotid bruits; Distal pulses 2+ bilaterally Cardiac:  normal S1, S2;  RRR; no murmur  Lungs:  clear to auscultation bilaterally, no wheezing, rhonchi or rales  Abd: soft, nontender, no hepatomegaly  Ext: no edema Musculoskeletal:  No deformities, BUE and BLE strength normal and equal Skin: warm and dry  Neuro:  CNs 2-12 intact, no focal abnormalities noted Psych:  Normal affect   EKG:  The EKG was personally reviewed and demonstrates: Sinus rhythm, 68 bpm,  PVCs  Relevant CV Studies:  Cath: 05/2018  Ost RPDA to RPDA lesion is 80% stenosed. RPDA lesion is 90% stenosed. Ost 2nd Mrg to 2nd Mrg lesion is 90% stenosed. Prox Cx to Mid Cx lesion is 65% stenosed. Ost 1st Diag lesion is 75% stenosed. Prox LAD to Mid LAD lesion is 100% stenosed.   Severe three-vessel coronary artery disease. Total occlusion of the mid LAD, filling late via collaterals from the right coronary.  The first diagonal contains proximal to mid eccentric 70% narrowing.   Severe obstruction in the dominant second obtuse marginal.   Severe multifocal disease in PDA and large left ventricular branch. Overall normal LV function with normal LVEDP.  EF 55%.   RECOMMENDATIONS:   TCTS consultation for multivessel coronary bypass grafting IV nitroglycerin to provide anti-ischemic protection. Add low-dose beta-blocker therapy hemodynamics tolerate.    Laboratory Data:  High Sensitivity Troponin:   Recent Labs  Lab 09/30/23 0840 09/30/23 1110  TROPONINIHS 3 3     Chemistry Recent Labs  Lab 09/30/23 0903  NA 140  K 4.0  CL 107  CO2 25  GLUCOSE 107*  BUN 18  CREATININE 1.11  CALCIUM 9.0  GFRNONAA >60  ANIONGAP 8    Recent Labs  Lab 09/30/23 0903  PROT 6.6  ALBUMIN 4.1  AST 22  ALT 30  ALKPHOS 41  BILITOT 0.6   Lipids No results for input(s): "CHOL", "TRIG", "HDL", "LABVLDL", "LDLCALC", "CHOLHDL" in the last 168 hours.  Hematology Recent Labs  Lab 09/30/23 0840  WBC 5.1  RBC 5.01  HGB 14.9  HCT 44.7  MCV 89.2  MCH 29.7  MCHC 33.3  RDW 12.7  PLT 168    Thyroid No results for input(s): "TSH", "FREET4" in the last 168 hours.  BNPNo results for input(s): "BNP", "PROBNP" in the last 168 hours.  DDimer No results for input(s): "DDIMER" in the last 168 hours.   Radiology/Studies:  US Abdomen Limited RUQ (LIVER/GB)  Result Date: 09/30/2023 CLINICAL DATA:  Right upper quadrant abdominal pain EXAM: ULTRASOUND ABDOMEN LIMITED RIGHT UPPER QUADRANT COMPARISON:  None Available. FINDINGS: Gallbladder: No gallstones or wall thickening visualized. No sonographic Murphy sign noted by sonographer. Common bile duct: Diameter: 5 mm Mild intrahepatic bile duct dilation. Liver: No focal lesion identified. Within normal limits in parenchymal echogenicity. Portal vein is patent on color Doppler imaging with normal direction of blood flow towards the liver. Other: None. IMPRESSION: 1. Mild intrahepatic bile duct dilation. Common bile duct measures 5 mm, within normal limits. Recommend correlation with liver function tests. 2. No cholelithiasis or sonographic evidence of acute cholecystitis. Electronically Signed   By: Agustin Cree M.D.   On: 09/30/2023 10:11   DG Chest Port 1 View  Result Date: 09/30/2023 CLINICAL DATA:  Chest and shoulder pain EXAM: PORTABLE CHEST 1 VIEW COMPARISON:  Chest radiograph dated 03/22/2022 FINDINGS: Normal lung volumes. No focal consolidations. No pleural effusion or pneumothorax. The heart size and mediastinal contours are within normal limits. Median sternotomy wires are nondisplaced. IMPRESSION: No active disease. Electronically Signed   By: Agustin Cree M.D.   On: 09/30/2023 10:08     Assessment and Plan:   Nicholas Chambers is a 62 y.o. male with a hx of CAD s/p 4v CABG '19 (LIMA to LAD, SVG to OM, SVG to PDA, SVG to PL), HTN, HLD, hypothyroidism, DM, GERD, paroxysmal atrial fibrillation who is being seen 09/30/2023 for the evaluation of chest pain at the request of Dr. Andria Meuse.  Chest  pain CAD status post four-vessel CABG  '19 Palpitations --Presented to the ED after episode this morning when he was walking and developed onset of palpitations, chest tightness and shortness of breath.  He took 2 of his 30 mg diltiazem tablets with improvement in symptoms.  In the ED troponins were negative x 2.  No further episodes of chest pain. -- He does have a history of atrial fibrillation as well as SVT noted on ZIO monitor earlier this year -- Given negative enzymes and no recurrent chest pain suspect this is related to SVT episode -- Will increase his Toprol XL to 50 mg daily -- Arrange for follow-up in the office  SVT -- Noted on ZIO monitor earlier this year -- As above increase Toprol XL to 50 mg daily -- If significant recurrent episodes despite medication adjustments would consider referral to EP  Paroxysmal atrial fibrillation -- Continue Eliquis 5 mg twice daily  Hyperlipidemia -- On statin   Risk Assessment/Risk Scores:   CHA2DS2-VASc Score = 3  This indicates a 3.2% annual risk of stroke. The patient's score is based upon: CHF History: 0 HTN History: 1 Diabetes History: 1 Stroke History: 0 Vascular Disease History: 1 Age Score: 0 Gender Score: 0     For questions or updates, please contact [facility] Please consult [email or web address] for contact info under    Signed, Laverda Page, NP  09/30/2023 2:29 PM  I have personally seen and examined this patient. I agree with the assessment and plan as outlined above.  62 yo male with history of CAD s/p 4V CABG in 2019, HTN, HLD, DM, GERD and PAF who presented to the ED today with c/o mild chest pressure. He is on Eliquis for PAF. He has done well over the past 5 years following his CABG. He has had recent palpitations. Outpatient cardiac monitor June 2024 with SVT. Today he felt palpitations followed by mild chest pressure and dyspnea. He took Cardizem that he uses on an "as needed" basis. EKG in ED without ischemic changes. PVCs noted.  Troponin negative x 2.  Normal exam Labs reviewed Chest pain is likely related to his palpitations. No evidence of ACS. Will increase his beta blocker and let him go home today from the ED.   Verne Carrow, MD, [facility] 09/30/2023 2:37 PM

## 2023-10-14 DIAGNOSIS — E0590 Thyrotoxicosis, unspecified without thyrotoxic crisis or storm: Secondary | ICD-10-CM | POA: Insufficient documentation

## 2023-10-14 DIAGNOSIS — E119 Type 2 diabetes mellitus without complications: Secondary | ICD-10-CM | POA: Insufficient documentation

## 2023-10-14 DIAGNOSIS — I10 Essential (primary) hypertension: Secondary | ICD-10-CM | POA: Insufficient documentation

## 2023-10-14 NOTE — Progress Notes (Deleted)
 Cardiology Office Note:  .   Date:  10/14/2023  ID:  Nicholas Chambers, DOB 1960-11-22, MRN [id number] PCP: Charlott Rakes, MD  [facility] Providers Cardiologist:  Gypsy Balsam, MD { Click to update primary MD,subspecialty MD or APP then REFRESH:1}   History of Present Illness: Nicholas Chambers   Nicholas Chambers is a 62 y.o. male with a past medical history of HTN, CAD s/p CABG x 4 2019, PAF on Eliquis, GERD, hypothyroidism, dyslipidemia.  06/02/2023 monitor average HR 70 bpm, 5 runs of SVT 03/22/2022 echo mild concentric LVH, EF 60-65% 05/2018 CABG x 4 LIMA to LAD, SVG to OM, SVG to PDA, SVG to PL   Chambers recently evaluated by Dr. Bing Matter on 04/23/2023, was doing okay but did have some issues with palpitations, repeat monitor was arranged which revealed an average heart rate of 70 bpm, 5 runs of SVT. Evaluated in the ED on 09/30/2023 for chest pain, troponins were negative, some PVCs noted on EKG, metoprolol was increased.    ROS: ROS   Studies Reviewed: .        Cardiac Studies & Procedures   CARDIAC CATHETERIZATION  CARDIAC CATHETERIZATION 05/18/2018  Narrative  Ost RPDA to RPDA lesion is 80% stenosed.  RPDA lesion is 90% stenosed.  Ost 2nd Mrg to 2nd Mrg lesion is 90% stenosed.  Prox Cx to Mid Cx lesion is 65% stenosed.  Ost 1st Diag lesion is 75% stenosed.  Prox LAD to Mid LAD lesion is 100% stenosed.   Severe three-vessel coronary artery disease.  Total occlusion of the mid LAD, filling late via collaterals from the right coronary.  The first diagonal contains proximal to mid eccentric 70% narrowing.  Severe obstruction in the dominant second obtuse marginal.  Severe multifocal disease in PDA and large left ventricular branch.  Overall normal LV function with normal LVEDP.  EF 55%.  RECOMMENDATIONS:   TCTS consultation for multivessel coronary bypass grafting  IV nitroglycerin to provide anti-ischemic protection.  Add low-dose beta-blocker therapy  hemodynamics tolerate.  {Invasive Cardiology Antiplatelet/Anticoag metric:21309}  Findings Coronary Findings Diagnostic  Dominance: Right  Left Anterior Descending Collaterals Mid LAD filled by collaterals from RPDA.  Prox LAD to Mid LAD lesion is 100% stenosed.  First Diagonal Branch Ost 1st Diag lesion is 75% stenosed.  Left Circumflex Prox Cx to Mid Cx lesion is 65% stenosed.  Second Obtuse Marginal Branch Ost 2nd Mrg to 2nd Mrg lesion is 90% stenosed.  Right Coronary Artery  Right Posterior Descending Artery Ost RPDA to RPDA lesion is 80% stenosed. RPDA lesion is 90% stenosed.  Intervention  No interventions have been documented.     ECHOCARDIOGRAM  ECHOCARDIOGRAM COMPLETE 05/18/2018  Narrative ** *[facility]* [address] [place], [place] [address] [phone]  ------------------------------------------------------------------- Transthoracic Echocardiography  Patient:    Nicholas, Chambers MR #:       [id number] Study Date: 05/18/2018 Gender:     M Age:        60 Height:     175.3 cm Weight:     124.1 kg BSA:        2.51 m^2 Pt. Status: Room:       6E04C  ATTENDING    Thurmon Fair, MD PERFORMING   [facility], Inpatient ADMITTING    Chilton Si, MD ORDERING     Chakravartti, Jaidip REFERRING    [place], Jaidip SONOGRAPHER  Celene Skeen, RDCS  cc:  ------------------------------------------------------------------- LV EF: 55% -   60%  ------------------------------------------------------------------- History:  PMH:  Chest Pain 786.50.  Risk factors:  Hypertension. Diabetes mellitus.  ------------------------------------------------------------------- Study Conclusions  - Procedure narrative: Transthoracic echocardiography. Image quality was adequate. The study was technically difficult. - Left ventricle: The cavity size was normal. Wall thickness was normal. Systolic function was normal. The  estimated ejection fraction was in the range of 55% to 60%. Although no diagnostic regional wall motion abnormality was identified, this possibility cannot be completely excluded on the basis of this study. Left ventricular diastolic function parameters were normal. - Left atrium: The atrium was mildly dilated.  ------------------------------------------------------------------- Study data:  No prior study was available for comparison.  Study status:  Routine.  Procedure:  The patient reported no pain pre or post test. Transthoracic echocardiography. Image quality was adequate. The study was technically difficult.  Study completion: There were no complications.          Transthoracic echocardiography.  M-mode, complete 2D, spectral Doppler, and color Doppler.  Birthdate:  Patient birthdate: 12/30/1960.  Age:  Patient is 62 yr old.  Sex:  Gender: male.    BMI: 40.4 kg/m^2.  Blood pressure:     105/68  Patient status:  Inpatient.  Study date: Study date: 05/18/2018. Study time: 02:42 PM.  Location:  Bedside.  -------------------------------------------------------------------  ------------------------------------------------------------------- Left ventricle:  The cavity size was normal. Wall thickness was normal. Systolic function was normal. The estimated ejection fraction was in the range of 55% to 60%. Although no diagnostic regional wall motion abnormality was identified, this possibility cannot be completely excluded on the basis of this study. The transmitral flow pattern was normal. The deceleration time of the early transmitral flow velocity was normal. The pulmonary vein flow pattern was normal. The tissue Doppler parameters were normal. Left ventricular diastolic function parameters were normal.  ------------------------------------------------------------------- Aortic valve:   Structurally normal valve.   Cusp separation was normal.  Doppler:  Transvalvular velocity was  within the normal range. There was no stenosis. There was no regurgitation.  ------------------------------------------------------------------- Aorta:  Aortic root: The aortic root was normal in size. Ascending aorta: The ascending aorta was normal in size.  ------------------------------------------------------------------- Mitral valve:   Structurally normal valve.   Leaflet separation was normal.  Doppler:  Transvalvular velocity was within the normal range. There was no evidence for stenosis. There was no regurgitation.  ------------------------------------------------------------------- Left atrium:  The atrium was mildly dilated.  ------------------------------------------------------------------- Right ventricle:  The cavity size was normal. Systolic function was normal.  ------------------------------------------------------------------- Pulmonic valve:    Structurally normal valve.   Cusp separation was normal.  Doppler:  Transvalvular velocity was within the normal range. There was no regurgitation.  ------------------------------------------------------------------- Tricuspid valve:   Structurally normal valve.   Leaflet separation was normal.  Doppler:  Transvalvular velocity was within the normal range. There was no regurgitation.  ------------------------------------------------------------------- Right atrium:  The atrium was normal in size.  ------------------------------------------------------------------- Pericardium:  There was no pericardial effusion.  ------------------------------------------------------------------- Measurements  Left ventricle                            Value        Reference LV ID, ED, PLAX chordal         (H)       52.43 mm     43 - 52 LV ID, ES, PLAX chordal                   33.1  mm  23 - 38 LV fx shortening, PLAX chordal            37    %      >=29 LV PW thickness, ED                       11.3  mm     ---------- IVS/LV  PW ratio, ED                       0.95         <=1.3 Stroke volume, 2D                         49    ml     ---------- Stroke volume/bsa, 2D                     19    ml/m^2 ---------- LV e&', lateral                            10.7  cm/s   ---------- LV E/e&', lateral                          5.35         ---------- LV e&', medial                             8.49  cm/s   ---------- LV E/e&', medial                           6.74         ---------- LV e&', average                            9.6   cm/s   ---------- LV E/e&', average                          5.96         ----------  Ventricular septum                        Value        Reference IVS thickness, ED                         10.7  mm     ----------  LVOT                                      Value        Reference LVOT ID, S                                20    mm     ---------- LVOT area                                 3.14  cm^2   ---------- LVOT peak velocity, S  78.3  cm/s   ---------- LVOT mean velocity, S                     56.2  cm/s   ---------- LVOT VTI, S                               15.6  cm     ---------- LVOT peak gradient, S                     2     mm Hg  ----------  Aorta                                     Value        Reference Aortic root ID, ED                        31    mm     ----------  Left atrium                               Value        Reference LA ID, A-P, ES                            42    mm     ---------- LA ID/bsa, A-P                            1.67  cm/m^2 <=2.2 LA volume, ES, 1-p A4C                    86.8  ml     ---------- LA volume/bsa, ES, 1-p A4C                34.5  ml/m^2 ---------- LA volume, ES, 1-p A2C                    37.1  ml     ---------- LA volume/bsa, ES, 1-p A2C                14.8  ml/m^2 ----------  Mitral valve                              Value        Reference Mitral E-wave peak velocity               57.2  cm/s   ---------- Mitral  A-wave peak velocity               52.4  cm/s   ---------- Mitral deceleration time                  180   ms     150 - 230 Mitral E/A ratio, peak                    1.1          ----------  Right atrium  Value        Reference RA ID, S-I, ES, A4C             (H)       51.4  mm     34 - 49 RA area, ES, A4C                (H)       20.9  cm^2   8.3 - 19.5 RA volume, ES, A/L                        68.1  ml     ---------- RA volume/bsa, ES, A/L                    27.1  ml/m^2 ----------  Right ventricle                           Value        Reference RV ID, minor axis, ED, A4C base           38    mm     ---------- RV ID, minor axis, ED, A4C mid            36.1  mm     ---------- RV ID, major axis, ED, A4C                68.3  mm     55 - 91 TAPSE                                     18.3  mm     ---------- RV s&', lateral, S                         15.3  cm/s   ----------  Legend: (L)  and  (H)  mark values outside specified reference range.  ------------------------------------------------------------------- Prepared and Electronically Authenticated by  Thurmon Fair, MD 2019-07-09T18:46:45   TEE  ECHO TEE 05/19/2018  Interpretation Summary  Mitral valve: Trace regurgitation.  Left atrium: Cavity is mildly dilated.  Right ventricle: Normal cavity size, wall thickness and ejection fraction.   MONITORS  LONG TERM MONITOR (3-14 DAYS) 05/28/2023  Narrative Patch Wear Time:  15 days and 23 hours (2024-06-13T11:39:06-399 to 2024-07-11T06:29:50-0400)  Monitor 1 Patient had a min HR of 53 bpm, max HR of 152 bpm, and avg HR of 70 bpm. Predominant underlying rhythm was Sinus Rhythm. 1 run of Supraventricular Tachycardia occurred lasting 18 beats with a max rate of 152 bpm (avg 131 bpm). Isolated SVEs were rare (<1.0%), SVE Couplets were rare (<1.0%), and no SVE Triplets were present. Isolated VEs were rare (<1.0%), and no VE Couplets or VE Triplets were  present.  Monitor 2 Patient had a min HR of 44 bpm, max HR of 133 bpm, and avg HR of 64 bpm. Predominant underlying rhythm was Sinus Rhythm. 4 Supraventricular Tachycardia runs occurred, the run with the fastest interval lasting 4 beats with a max rate of 133 bpm, the longest lasting 15.2 secs with an avg rate of 99 bpm. Isolated SVEs were rare (<1.0%), SVE Couplets were rare (<1.0%), and SVE Triplets were rare (<1.0%). Isolated VEs were rare (<1.0%), and no VE Couplets or VE Triplets were present.  Summary conclusions: total of 5 episode of  supraventricular tachycardia longest episode 15.2 seconds.           Risk Assessment/Calculations:    CHA2DS2-VASc Score = 3  {Confirm score is correct.  If not, click here to update score.  REFRESH note.  :1} This indicates a 3.2% annual risk of stroke. The patient's score is based upon: CHF History: 0 HTN History: 1 Diabetes History: 1 Stroke History: 0 Vascular Disease History: 1 Age Score: 0 Gender Score: 0   {This patient has a significant risk of stroke if diagnosed with atrial fibrillation.  Please consider VKA or DOAC agent for anticoagulation if the bleeding risk is acceptable.   You can also use the SmartPhrase .[email or web address] for documentation.   :[id number]} No BP recorded.  {Refresh Note OR Click here to enter BP  :1}***       Physical Exam:   VS:  There were no vitals taken for this visit.   Wt Readings from Last 3 Encounters:  09/30/23 200 lb (90.7 kg)  04/23/23 207 lb 3.2 oz (94 kg)  06/09/22 213 lb (96.6 kg)    GEN: Well nourished, well developed in no acute distress /NECK: No JVD; No carotid bruits CARDIAC: ***RRR, no murmurs, rubs, gallops RESPIRATORY:  Clear to auscultation without rales, wheezing or rhonchi  ABDOMEN: Soft, non-tender, non-distended EXTREMITIES:  No edema; No deformity   ASSESSMENT AND PLAN: .   PAF/hypercoagulable state-creatinine 1.11, hemoglobin 14.9, hematocrit 44.7 on 09/30/2023 CAD-s/p CABG x 4 in  2019 Hypertension- Hyperlipidemia-    {Are you ordering a CV Procedure (e.g. stress test, cath, DCCV, TEE, etc)?   Press F2        :[id number]}  Dispo: ***  Signed, Flossie Dibble, NP

## 2023-10-15 ENCOUNTER — Ambulatory Visit: Payer: BC Managed Care – PPO | Attending: Cardiology | Admitting: Cardiology

## 2023-10-15 DIAGNOSIS — E785 Hyperlipidemia, unspecified: Secondary | ICD-10-CM

## 2023-10-15 DIAGNOSIS — I10 Essential (primary) hypertension: Secondary | ICD-10-CM

## 2023-10-15 DIAGNOSIS — I2510 Atherosclerotic heart disease of native coronary artery without angina pectoris: Secondary | ICD-10-CM

## 2023-10-15 DIAGNOSIS — I480 Paroxysmal atrial fibrillation: Secondary | ICD-10-CM

## 2023-10-15 DIAGNOSIS — Z951 Presence of aortocoronary bypass graft: Secondary | ICD-10-CM

## 2023-10-21 ENCOUNTER — Telehealth: Payer: Self-pay

## 2023-10-21 NOTE — Telephone Encounter (Signed)
Transition Care Management Follow-up Telephone Call Date of discharge and from where: [facility] 11/20 How have you been since you were released from the hospital? Patient stated to be doing the same and will be calling to follow up with provider this week  Any questions or concerns? No  Items Reviewed: Did the pt receive and understand the discharge instructions provided? Yes  Medications obtained and verified? Yes  Other? No  Any new allergies since your discharge? No  Dietary orders reviewed? No Do you have support at home? Yes     Follow up appointments reviewed:  PCP Hospital f/u appt confirmed? No  Scheduled to see  on  @ . Specialist Hospital f/u appt confirmed? No  Scheduled to see  on @ . Are transportation arrangements needed? Yes  If their condition worsens, is the pt aware to call PCP or go to the Emergency Dept.? Yes Was the patient provided with contact information for the PCP's office or ED? Yes Was to pt encouraged to call back with questions or concerns? Yes

## 2024-01-22 DIAGNOSIS — R9431 Abnormal electrocardiogram [ECG] [EKG]: Secondary | ICD-10-CM | POA: Diagnosis not present

## 2024-01-22 DIAGNOSIS — Z79899 Other long term (current) drug therapy: Secondary | ICD-10-CM | POA: Diagnosis not present

## 2024-01-22 DIAGNOSIS — E7800 Pure hypercholesterolemia, unspecified: Secondary | ICD-10-CM | POA: Diagnosis not present

## 2024-01-22 DIAGNOSIS — Z7982 Long term (current) use of aspirin: Secondary | ICD-10-CM | POA: Diagnosis not present

## 2024-01-22 DIAGNOSIS — E039 Hypothyroidism, unspecified: Secondary | ICD-10-CM | POA: Diagnosis not present

## 2024-01-22 DIAGNOSIS — Z7985 Long-term (current) use of injectable non-insulin antidiabetic drugs: Secondary | ICD-10-CM | POA: Diagnosis not present

## 2024-01-22 DIAGNOSIS — Z951 Presence of aortocoronary bypass graft: Secondary | ICD-10-CM | POA: Diagnosis not present

## 2024-01-22 DIAGNOSIS — I10 Essential (primary) hypertension: Secondary | ICD-10-CM | POA: Diagnosis not present

## 2024-01-22 DIAGNOSIS — I4891 Unspecified atrial fibrillation: Secondary | ICD-10-CM | POA: Diagnosis not present

## 2024-01-22 DIAGNOSIS — R0602 Shortness of breath: Secondary | ICD-10-CM | POA: Diagnosis not present

## 2024-01-22 DIAGNOSIS — E119 Type 2 diabetes mellitus without complications: Secondary | ICD-10-CM | POA: Diagnosis not present

## 2024-01-22 DIAGNOSIS — R079 Chest pain, unspecified: Secondary | ICD-10-CM | POA: Diagnosis not present

## 2024-01-23 ENCOUNTER — Other Ambulatory Visit: Payer: Self-pay | Admitting: Cardiology

## 2024-01-23 DIAGNOSIS — I4891 Unspecified atrial fibrillation: Secondary | ICD-10-CM | POA: Diagnosis not present

## 2024-01-23 DIAGNOSIS — R079 Chest pain, unspecified: Secondary | ICD-10-CM | POA: Diagnosis not present

## 2024-01-23 DIAGNOSIS — R0602 Shortness of breath: Secondary | ICD-10-CM | POA: Diagnosis not present

## 2024-01-23 DIAGNOSIS — Z79899 Other long term (current) drug therapy: Secondary | ICD-10-CM | POA: Diagnosis not present

## 2024-01-23 DIAGNOSIS — R9431 Abnormal electrocardiogram [ECG] [EKG]: Secondary | ICD-10-CM | POA: Diagnosis not present

## 2024-02-05 DIAGNOSIS — E1159 Type 2 diabetes mellitus with other circulatory complications: Secondary | ICD-10-CM | POA: Diagnosis not present

## 2024-02-05 DIAGNOSIS — E785 Hyperlipidemia, unspecified: Secondary | ICD-10-CM | POA: Diagnosis not present

## 2024-02-05 DIAGNOSIS — E039 Hypothyroidism, unspecified: Secondary | ICD-10-CM | POA: Diagnosis not present

## 2024-02-10 ENCOUNTER — Other Ambulatory Visit: Payer: Self-pay | Admitting: Cardiology

## 2024-02-12 DIAGNOSIS — Z6832 Body mass index (BMI) 32.0-32.9, adult: Secondary | ICD-10-CM | POA: Diagnosis not present

## 2024-02-12 DIAGNOSIS — E1159 Type 2 diabetes mellitus with other circulatory complications: Secondary | ICD-10-CM | POA: Diagnosis not present

## 2024-02-12 DIAGNOSIS — E785 Hyperlipidemia, unspecified: Secondary | ICD-10-CM | POA: Diagnosis not present

## 2024-02-12 DIAGNOSIS — E039 Hypothyroidism, unspecified: Secondary | ICD-10-CM | POA: Diagnosis not present

## 2024-02-12 DIAGNOSIS — R1011 Right upper quadrant pain: Secondary | ICD-10-CM | POA: Diagnosis not present

## 2024-02-12 DIAGNOSIS — I2510 Atherosclerotic heart disease of native coronary artery without angina pectoris: Secondary | ICD-10-CM | POA: Diagnosis not present

## 2024-02-19 DIAGNOSIS — Z6831 Body mass index (BMI) 31.0-31.9, adult: Secondary | ICD-10-CM | POA: Diagnosis not present

## 2024-02-19 DIAGNOSIS — R1011 Right upper quadrant pain: Secondary | ICD-10-CM | POA: Diagnosis not present

## 2024-03-02 DIAGNOSIS — R1011 Right upper quadrant pain: Secondary | ICD-10-CM | POA: Diagnosis not present

## 2024-05-10 ENCOUNTER — Other Ambulatory Visit: Payer: Self-pay | Admitting: Cardiology

## 2024-07-06 ENCOUNTER — Other Ambulatory Visit: Payer: Self-pay | Admitting: Cardiology

## 2024-08-02 ENCOUNTER — Other Ambulatory Visit: Payer: Self-pay | Admitting: Cardiology

## 2024-08-04 ENCOUNTER — Telehealth: Payer: Self-pay | Admitting: Cardiology

## 2024-08-04 MED ORDER — EZETIMIBE 10 MG PO TABS: 10.0000 mg | ORAL_TABLET | Freq: Every day | ORAL | 0 refills | Status: DC

## 2024-08-04 NOTE — Telephone Encounter (Signed)
*  STAT* If patient is at the pharmacy, call can be transferred to refill team.   1. Which medications need to be refilled? (please list name of each medication and dose if known) ezetimibe  (ZETIA ) 10 MG tablet    2. Would you like to learn more about the convenience, safety, & potential cost savings by using the [facility]? No   3. Are you open to using the [facility] (Type [facility].) No   4. Which pharmacy/location (including street and city if local pharmacy) is medication to be sent to?  [facility] - [place], [place] - [address]   5. Do they need a 30 day or 90 day supply? 90 day

## 2024-08-04 NOTE — Telephone Encounter (Signed)
 Pt's medication was sent to pt's pharmacy as requested. Confirmation received.

## 2024-08-23 ENCOUNTER — Encounter: Payer: Self-pay | Admitting: Cardiology

## 2024-08-23 NOTE — Progress Notes (Signed)
 Cardiology Office Note   Date:  08/26/2024  ID:  Nicholas Chambers, DOB [birth year], MRN [id number] PCP: Nicholas Glisson, MD  [facility] Providers Cardiologist:  Nicholas Fitch, MD     History of Present Illness Nicholas Chambers is a 63 y.o. male with a past medical history of atrial fibrillation, CAD s/p CABG x 4, moderate bilateral carotid artery stenosis, hypertension, GERD, DM2, dyslipidemia  05/28/2023 monitor average heart rate 70 bpm, predominant rhythm sinus, 4 episode of SVT 03/22/2022 echo 05/19/2018 CABG x 4 LIMA to LAD, SVG to obtuse marginal, SVG to PDA, SVG to PL 05/18/2018 carotid duplex moderate carotid artery stenosis 05/18/2018 left heart cath severe three-vessel CAD  In July 2019 he presented to the hospital with complaints of chest pain with exertion with associated shortness of breath, presented to the emergency department ultimately underwent left heart cath revealing severe three-vessel CAD.  Carotid duplex at that time in preparation for CABG revealed moderate bilateral carotid artery stenosis.  In the postoperative setting from his bypass he developed atrial fibrillation, was given IV amiodarone .  He established care with Dr. Fitch in 2019 following his hospitalization as outlined above.  He has worn a repeat monitor on a few occasion revealing episodes of SVT no recurrence of atrial fibrillation.  Most recently was evaluated by Dr. Fitch on 04/23/2023, repeat monitor was arranged revealing episodes of SVT and no atrial fibrillation, no changes made to medications or plan of care and he was advised follow-up in 6 months.  He presents today for follow-up of his CAD.  He has been doing well since he was last evaluated in our office.  He follows closely with his PCP and sees them every 3 months.  He walks for exercise 3 miles every day.  His blood pressure is well-controlled and his lipids are at goal. He denies chest pain, palpitations, dyspnea, pnd,  orthopnea, n, v, dizziness, syncope, edema, weight gain, or early satiety.    ROS: Review of Systems  All other systems reviewed and are negative.    Studies Reviewed      Cardiac Studies & Procedures   ______________________________________________________________________________________________ CARDIAC CATHETERIZATION  CARDIAC CATHETERIZATION 05/18/2018  Conclusion  Ost RPDA to RPDA lesion is 80% stenosed.  RPDA lesion is 90% stenosed.  Ost 2nd Mrg to 2nd Mrg lesion is 90% stenosed.  Prox Cx to Mid Cx lesion is 65% stenosed.  Ost 1st Diag lesion is 75% stenosed.  Prox LAD to Mid LAD lesion is 100% stenosed.   Severe three-vessel coronary artery disease.  Total occlusion of the mid LAD, filling late via collaterals from the right coronary.  The first diagonal contains proximal to mid eccentric 70% narrowing.  Severe obstruction in the dominant second obtuse marginal.  Severe multifocal disease in PDA and large left ventricular branch.  Overall normal LV function with normal LVEDP.  EF 55%.  RECOMMENDATIONS:   TCTS consultation for multivessel coronary bypass grafting  IV nitroglycerin  to provide anti-ischemic protection.  Add low-dose beta-blocker therapy hemodynamics tolerate.    Findings Coronary Findings Diagnostic  Dominance: Right  Left Anterior Descending Collaterals Mid LAD filled by collaterals from RPDA.  Prox LAD to Mid LAD lesion is 100% stenosed.  First Diagonal Branch Ost 1st Diag lesion is 75% stenosed.  Left Circumflex Prox Cx to Mid Cx lesion is 65% stenosed.  Second Obtuse Marginal Branch Ost 2nd Mrg to 2nd Mrg lesion is 90% stenosed.  Right Coronary Artery  Right Posterior Descending Artery Ost RPDA to  RPDA lesion is 80% stenosed. RPDA lesion is 90% stenosed.  Intervention  No interventions have been documented.     ECHOCARDIOGRAM  ECHOCARDIOGRAM COMPLETE 05/18/2018  Narrative *Nicholas Chambers* *Nicholas Chambers* [address] [place], [place] [address] [phone]  ------------------------------------------------------------------- Transthoracic Echocardiography  Patient:    Nicholas, Chambers MR #:       [id number] Study Date: 05/18/2018 Gender:     M Age:        48 Height:     175.3 cm Weight:     124.1 kg BSA:        2.51 m^2 Pt. Status: Room:       6E04C  ATTENDING    Nicholas Balding, MD PERFORMING   Nicholas, Chambers ADMITTING    Nicholas Scarce, MD ORDERING     Nicholas, Chambers REFERRING    Nicholas, Chambers SONOGRAPHER  Nicholas Chambers, RDCS  cc:  ------------------------------------------------------------------- LV EF: 55% -   60%  ------------------------------------------------------------------- History:   PMH:  Chest Pain 786.50.  Risk factors:  Hypertension. Diabetes mellitus.  ------------------------------------------------------------------- Study Conclusions  - Procedure narrative: Transthoracic echocardiography. Image quality was adequate. The study was technically difficult. - Left ventricle: The cavity size was normal. Wall thickness was normal. Systolic function was normal. The estimated ejection fraction was in the range of 55% to 60%. Although no diagnostic regional wall motion abnormality was identified, this possibility cannot be completely excluded on the basis of this study. Left ventricular diastolic function parameters were normal. - Left atrium: The atrium was mildly dilated.  ------------------------------------------------------------------- Study data:  No prior study was available for comparison.  Study status:  Routine.  Procedure:  The patient reported no pain pre or post test. Transthoracic echocardiography. Image quality was adequate. The study was technically difficult.  Study completion: There were no complications.          Transthoracic echocardiography.  M-mode, complete 2D, spectral Doppler, and color Doppler.   Birthdate:  Patient birthdate: 1961/07/30.  Age:  Patient is 63 yr old.  Sex:  Gender: male.    BMI: 40.4 kg/m^2.  Blood pressure:     105/68  Patient status:  Chambers.  Study date: Study date: 05/18/2018. Study time: 02:42 PM.  Location:  Bedside.  -------------------------------------------------------------------  ------------------------------------------------------------------- Left ventricle:  The cavity size was normal. Wall thickness was normal. Systolic function was normal. The estimated ejection fraction was in the range of 55% to 60%. Although no diagnostic regional wall motion abnormality was identified, this possibility cannot be completely excluded on the basis of this study. The transmitral flow pattern was normal. The deceleration time of the early transmitral flow velocity was normal. The pulmonary vein flow pattern was normal. The tissue Doppler parameters were normal. Left ventricular diastolic function parameters were normal.  ------------------------------------------------------------------- Aortic valve:   Structurally normal valve.   Cusp separation was normal.  Doppler:  Transvalvular velocity was within the normal range. There was no stenosis. There was no regurgitation.  ------------------------------------------------------------------- Aorta:  Aortic root: The aortic root was normal in size. Ascending aorta: The ascending aorta was normal in size.  ------------------------------------------------------------------- Mitral valve:   Structurally normal valve.   Leaflet separation was normal.  Doppler:  Transvalvular velocity was within the normal range. There was no evidence for stenosis. There was no regurgitation.  ------------------------------------------------------------------- Left atrium:  The atrium was mildly dilated.  ------------------------------------------------------------------- Right ventricle:  The cavity size was normal. Systolic  function was normal.  ------------------------------------------------------------------- Pulmonic valve:    Structurally normal  valve.   Cusp separation was normal.  Doppler:  Transvalvular velocity was within the normal range. There was no regurgitation.  ------------------------------------------------------------------- Tricuspid valve:   Structurally normal valve.   Leaflet separation was normal.  Doppler:  Transvalvular velocity was within the normal range. There was no regurgitation.  ------------------------------------------------------------------- Right atrium:  The atrium was normal in size.  ------------------------------------------------------------------- Pericardium:  There was no pericardial effusion.  ------------------------------------------------------------------- Measurements  Left ventricle                            Value        Reference LV ID, ED, PLAX chordal         (H)       52.43 mm     43 - 52 LV ID, ES, PLAX chordal                   33.1  mm     23 - 38 LV fx shortening, PLAX chordal            37    %      >=29 LV PW thickness, ED                       11.3  mm     ---------- IVS/LV PW ratio, ED                       0.95         <=1.3 Stroke volume, 2D                         49    ml     ---------- Stroke volume/bsa, 2D                     19    ml/m^2 ---------- LV e&', lateral                            10.7  cm/s   ---------- LV E/e&', lateral                          5.35         ---------- LV e&', medial                             8.49  cm/s   ---------- LV E/e&', medial                           6.74         ---------- LV e&', average                            9.6   cm/s   ---------- LV E/e&', average                          5.96         ----------  Ventricular septum                        Value        Reference IVS thickness, ED  10.7  mm     ----------  LVOT                                      Value         Reference LVOT ID, S                                20    mm     ---------- LVOT area                                 3.14  cm^2   ---------- LVOT peak velocity, S                     78.3  cm/s   ---------- LVOT mean velocity, S                     56.2  cm/s   ---------- LVOT VTI, S                               15.6  cm     ---------- LVOT peak gradient, S                     2     mm Hg  ----------  Aorta                                     Value        Reference Aortic root ID, ED                        31    mm     ----------  Left atrium                               Value        Reference LA ID, A-P, ES                            42    mm     ---------- LA ID/bsa, A-P                            1.67  cm/m^2 <=2.2 LA volume, ES, 1-p A4C                    86.8  ml     ---------- LA volume/bsa, ES, 1-p A4C                34.5  ml/m^2 ---------- LA volume, ES, 1-p A2C                    37.1  ml     ---------- LA volume/bsa, ES, 1-p A2C                14.8  ml/m^2 ----------  Mitral valve  Value        Reference Mitral E-wave peak velocity               57.2  cm/s   ---------- Mitral A-wave peak velocity               52.4  cm/s   ---------- Mitral deceleration time                  180   ms     150 - 230 Mitral E/A ratio, peak                    1.1          ----------  Right atrium                              Value        Reference RA ID, S-I, ES, A4C             (H)       51.4  mm     34 - 49 RA area, ES, A4C                (H)       20.9  cm^2   8.3 - 19.5 RA volume, ES, A/L                        68.1  ml     ---------- RA volume/bsa, ES, A/L                    27.1  ml/m^2 ----------  Right ventricle                           Value        Reference RV ID, minor axis, ED, A4C base           38    mm     ---------- RV ID, minor axis, ED, A4C mid            36.1  mm     ---------- RV ID, major axis, ED, A4C                68.3  mm     55 -  91 TAPSE                                     18.3  mm     ---------- RV s&', lateral, S                         15.3  cm/s   ----------  Legend: (L)  and  (H)  mark values outside specified reference range.  ------------------------------------------------------------------- Prepared and Electronically Authenticated by  Nicholas Balding, MD 2019-07-09T18:46:45   TEE  ECHO TEE 05/19/2018  Interpretation Summary  Mitral valve: Trace regurgitation.  Left atrium: Cavity is mildly dilated.  Right ventricle: Normal cavity size, wall thickness and ejection fraction.  MONITORS  LONG TERM MONITOR (3-14 DAYS) 05/28/2023  Narrative Patch Wear Time:  15 days and 23 hours (2024-06-13T11:39:06-399 to 2024-07-11T06:29:50-0400)  Monitor 1 Patient had a min HR of 53 bpm, max HR of 152 bpm, and avg HR of 70 bpm. Predominant  underlying rhythm was Sinus Rhythm. 1 run of Supraventricular Tachycardia occurred lasting 18 beats with a max rate of 152 bpm (avg 131 bpm). Isolated SVEs were rare (<1.0%), SVE Couplets were rare (<1.0%), and no SVE Triplets were present. Isolated VEs were rare (<1.0%), and no VE Couplets or VE Triplets were present.  Monitor 2 Patient had a min HR of 44 bpm, max HR of 133 bpm, and avg HR of 64 bpm. Predominant underlying rhythm was Sinus Rhythm. 4 Supraventricular Tachycardia runs occurred, the run with the fastest interval lasting 4 beats with a max rate of 133 bpm, the longest lasting 15.2 secs with an avg rate of 99 bpm. Isolated SVEs were rare (<1.0%), SVE Couplets were rare (<1.0%), and SVE Triplets were rare (<1.0%). Isolated VEs were rare (<1.0%), and no VE Couplets or VE Triplets were present.  Summary conclusions: total of 5 episode of supraventricular tachycardia longest episode 15.2 seconds.       ______________________________________________________________________________________________      Risk Assessment/Calculations           Physical Exam VS:   BP 120/80   Pulse 62   Ht 5' 9 (1.753 m)   Wt 218 lb (98.9 kg)   SpO2 98%   BMI 32.19 kg/m        Wt Readings from Last 3 Encounters:  08/26/24 218 lb (98.9 kg)  09/30/23 200 lb (90.7 kg)  04/23/23 207 lb 3.2 oz (94 kg)    GEN: Well nourished, well developed in no acute distress NECK: No JVD; No carotid bruits CARDIAC: RRR, no murmurs, rubs, gallops RESPIRATORY:  Clear to auscultation without rales, wheezing or rhonchi  ABDOMEN: Soft, non-tender, non-distended EXTREMITIES:  No edema; No deformity   ASSESSMENT AND PLAN CAD - s/p CABG x 4, Stable with no anginal symptoms. No indication for ischemic evaluation.  He is on Eliquis 5 mg twice daily therefore he is not on aspirin --no indication for dose reduction, continue Repatha, Zetia , metoprolol . Heart healthy diet and regular cardiovascular exercise encouraged.  Lab work checked this morning by his PCP in preparation for his upcoming visit, we will request copy of lab work from his PCP, denies hematochezia, hematuria, hemoptysis.  Paroxysmal atrial fibrillation/hypercoagulable state-CHA2DS2-VASc score is 3, continue Eliquis 5 mg twice daily-as no indication for dose reduction will request labs from PCP, denies hematochezia, hematuria, hemoptysis.  Carotid artery stenosis -no bruits appreciated, currently on Repatha, Zetia , Eliquis.  Suggest repeat imaging at next office visit for surveillance.  Hypertension -blood pressure is well-controlled today at 120/80, continue metoprolol  succinate 50 mg daily, Benicar 20 mg daily.  Dyslipidemia-formally monitored by his PCP and he just had his lab work checked today in preparation for his appointment tomorrow but lipids on 02/05/2024 revealed LDL of 63, continue Repatha, continue Zetia .       Dispo:  Follow up 1 year.   Signed, Delon JAYSON Hoover, NP

## 2024-08-26 ENCOUNTER — Encounter: Payer: Self-pay | Admitting: Cardiology

## 2024-08-26 ENCOUNTER — Ambulatory Visit: Attending: Cardiology | Admitting: Cardiology

## 2024-08-26 VITALS — BP 120/80 | HR 62 | Ht 69.0 in | Wt 218.0 lb

## 2024-08-26 DIAGNOSIS — D6859 Other primary thrombophilia: Secondary | ICD-10-CM | POA: Diagnosis not present

## 2024-08-26 DIAGNOSIS — E785 Hyperlipidemia, unspecified: Secondary | ICD-10-CM

## 2024-08-26 DIAGNOSIS — E1159 Type 2 diabetes mellitus with other circulatory complications: Secondary | ICD-10-CM | POA: Diagnosis not present

## 2024-08-26 DIAGNOSIS — Z951 Presence of aortocoronary bypass graft: Secondary | ICD-10-CM

## 2024-08-26 DIAGNOSIS — I6523 Occlusion and stenosis of bilateral carotid arteries: Secondary | ICD-10-CM

## 2024-08-26 DIAGNOSIS — I2510 Atherosclerotic heart disease of native coronary artery without angina pectoris: Secondary | ICD-10-CM

## 2024-08-26 DIAGNOSIS — I480 Paroxysmal atrial fibrillation: Secondary | ICD-10-CM

## 2024-08-26 DIAGNOSIS — I10 Essential (primary) hypertension: Secondary | ICD-10-CM

## 2024-08-26 DIAGNOSIS — E039 Hypothyroidism, unspecified: Secondary | ICD-10-CM | POA: Diagnosis not present

## 2024-08-26 NOTE — Patient Instructions (Signed)
 Medication Instructions:   *If you need a refill on your cardiac medications before your next appointment, please call your pharmacy*  Lab Work:  If you have labs (blood work) drawn today and your tests are completely normal, you will receive your results only by: MyChart Message (if you have MyChart) OR A paper copy in the mail If you have any lab test that is abnormal or we need to change your treatment, we will call you to review the results.  Testing/Procedures:   Follow-Up: At [facility], you and your health needs are our priority.  As part of our continuing mission to provide you with exceptional heart care, our providers are all part of one team.  This team includes your primary Cardiologist (physician) and Advanced Practice Providers or APPs (Physician Assistants and Nurse Practitioners) who all work together to provide you with the care you need, when you need it.  Your next appointment:   1 year(s)  Provider:   Lamar Fitch, MD    We recommend signing up for the patient portal called MyChart.  Sign up information is provided on this After Visit Summary.  MyChart is used to connect with patients for Virtual Visits (Telemedicine).  Patients are able to view lab/test results, encounter notes, upcoming appointments, etc.  Non-urgent messages can be sent to your provider as well.   To learn more about what you can do with MyChart, go to [email or web address].   Other Instructions  Continue the great work with you are doing with your health!!

## 2024-09-02 ENCOUNTER — Other Ambulatory Visit: Payer: Self-pay | Admitting: Cardiology

## 2024-09-02 DIAGNOSIS — Z6831 Body mass index (BMI) 31.0-31.9, adult: Secondary | ICD-10-CM | POA: Diagnosis not present

## 2024-09-02 DIAGNOSIS — E785 Hyperlipidemia, unspecified: Secondary | ICD-10-CM | POA: Diagnosis not present

## 2024-09-02 DIAGNOSIS — I10 Essential (primary) hypertension: Secondary | ICD-10-CM | POA: Diagnosis not present

## 2024-09-02 DIAGNOSIS — E1159 Type 2 diabetes mellitus with other circulatory complications: Secondary | ICD-10-CM | POA: Diagnosis not present

## 2024-09-02 DIAGNOSIS — E039 Hypothyroidism, unspecified: Secondary | ICD-10-CM | POA: Diagnosis not present

## 2024-09-13 ENCOUNTER — Telehealth: Payer: Self-pay | Admitting: Cardiology

## 2024-09-13 MED ORDER — EZETIMIBE 10 MG PO TABS: 10.0000 mg | ORAL_TABLET | Freq: Every day | ORAL | 3 refills | Status: AC

## 2024-09-13 NOTE — Telephone Encounter (Signed)
 Pt's medication was sent to pt's pharmacy as requested. Confirmation received.

## 2024-09-13 NOTE — Telephone Encounter (Signed)
*  STAT* If patient is at the pharmacy, call can be transferred to refill team.   1. Which medications need to be refilled? (please list name of each medication and dose if known)   ezetimibe  (ZETIA ) 10 MG tablet     2. Would you like to learn more about the convenience, safety, & potential cost savings by using the [facility]? No    3. Are you open to using the [facility] (Type [facility]. No    4. Which pharmacy/location (including street and city if local pharmacy) is medication to be sent to? [facility] - [place], [place] - [address]     5. Do they need a 30 day or 90 day supply? 90 day   Pt is out of the medication.  Pt wants Rx sent to [facility], but it was sent to [facility] on 10/27. Pt requesting it be sent to correct pharmacy. Please advise.

## 2024-10-17 DIAGNOSIS — E785 Hyperlipidemia, unspecified: Secondary | ICD-10-CM | POA: Diagnosis not present
